# Patient Record
Sex: Female | Born: 1945
Health system: Southern US, Community
[De-identification: ages and names within clinical notes are randomized; demographics above are authoritative.]

## PROBLEM LIST (undated history)

## (undated) DIAGNOSIS — I10 Essential (primary) hypertension: Secondary | ICD-10-CM

## (undated) DIAGNOSIS — M1990 Unspecified osteoarthritis, unspecified site: Secondary | ICD-10-CM

## (undated) DIAGNOSIS — I2510 Atherosclerotic heart disease of native coronary artery without angina pectoris: Secondary | ICD-10-CM

## (undated) DIAGNOSIS — J849 Interstitial pulmonary disease, unspecified: Secondary | ICD-10-CM

## (undated) DIAGNOSIS — E7800 Pure hypercholesterolemia, unspecified: Secondary | ICD-10-CM

## (undated) HISTORY — PX: OTHER SURGICAL HISTORY: SHX169

## (undated) HISTORY — DX: Essential (primary) hypertension: I10

## (undated) HISTORY — PX: ANTERIOR CRUCIATE LIGAMENT REPAIR: SHX115

## (undated) HISTORY — PX: NASAL SINUS SURGERY: SHX719

## (undated) HISTORY — DX: Pure hypercholesterolemia, unspecified: E78.00

## (undated) HISTORY — PX: PELVIC LAPAROSCOPY: SHX162

## (undated) HISTORY — DX: Unspecified osteoarthritis, unspecified site: M19.90

## (undated) HISTORY — PX: COSMETIC SURGERY: SHX468

---

## 1986-08-16 HISTORY — PX: ABDOMINAL SURGERY: SHX537

## 1998-08-05 ENCOUNTER — Other Ambulatory Visit: Admission: RE | Admit: 1998-08-05 | Discharge: 1998-08-05 | Payer: Self-pay | Admitting: Obstetrics and Gynecology

## 1998-09-30 ENCOUNTER — Ambulatory Visit (HOSPITAL_COMMUNITY): Admission: RE | Admit: 1998-09-30 | Discharge: 1998-09-30 | Payer: Self-pay | Admitting: Gastroenterology

## 1999-07-20 ENCOUNTER — Ambulatory Visit (HOSPITAL_COMMUNITY): Admission: RE | Admit: 1999-07-20 | Discharge: 1999-07-20 | Payer: Self-pay | Admitting: Gastroenterology

## 1999-09-22 ENCOUNTER — Other Ambulatory Visit: Admission: RE | Admit: 1999-09-22 | Discharge: 1999-09-22 | Payer: Self-pay | Admitting: Obstetrics and Gynecology

## 2000-07-18 ENCOUNTER — Other Ambulatory Visit: Admission: RE | Admit: 2000-07-18 | Discharge: 2000-07-18 | Payer: Self-pay | Admitting: Obstetrics and Gynecology

## 2001-11-15 ENCOUNTER — Other Ambulatory Visit: Admission: RE | Admit: 2001-11-15 | Discharge: 2001-11-15 | Payer: Self-pay | Admitting: Obstetrics and Gynecology

## 2002-11-26 ENCOUNTER — Other Ambulatory Visit: Admission: RE | Admit: 2002-11-26 | Discharge: 2002-11-26 | Payer: Self-pay | Admitting: Obstetrics and Gynecology

## 2003-04-15 ENCOUNTER — Encounter: Payer: Self-pay | Admitting: Otolaryngology

## 2003-04-15 ENCOUNTER — Encounter: Admission: RE | Admit: 2003-04-15 | Discharge: 2003-04-15 | Payer: Self-pay | Admitting: Otolaryngology

## 2003-12-10 ENCOUNTER — Other Ambulatory Visit: Admission: RE | Admit: 2003-12-10 | Discharge: 2003-12-10 | Payer: Self-pay | Admitting: Obstetrics and Gynecology

## 2004-09-07 ENCOUNTER — Ambulatory Visit (HOSPITAL_COMMUNITY): Admission: RE | Admit: 2004-09-07 | Discharge: 2004-09-07 | Payer: Self-pay | Admitting: Cardiology

## 2004-09-21 ENCOUNTER — Ambulatory Visit: Payer: Self-pay | Admitting: Internal Medicine

## 2004-12-29 ENCOUNTER — Other Ambulatory Visit: Admission: RE | Admit: 2004-12-29 | Discharge: 2004-12-29 | Payer: Self-pay | Admitting: Obstetrics and Gynecology

## 2006-01-04 ENCOUNTER — Other Ambulatory Visit: Admission: RE | Admit: 2006-01-04 | Discharge: 2006-01-04 | Payer: Self-pay | Admitting: Obstetrics and Gynecology

## 2007-02-10 ENCOUNTER — Other Ambulatory Visit: Admission: RE | Admit: 2007-02-10 | Discharge: 2007-02-10 | Payer: Self-pay | Admitting: Obstetrics and Gynecology

## 2007-08-17 HISTORY — PX: OOPHORECTOMY: SHX86

## 2007-09-05 ENCOUNTER — Encounter: Admission: RE | Admit: 2007-09-05 | Discharge: 2007-09-05 | Payer: Self-pay | Admitting: Internal Medicine

## 2008-02-26 ENCOUNTER — Encounter: Admission: RE | Admit: 2008-02-26 | Discharge: 2008-02-26 | Payer: Self-pay | Admitting: Internal Medicine

## 2008-03-06 ENCOUNTER — Other Ambulatory Visit: Admission: RE | Admit: 2008-03-06 | Discharge: 2008-03-06 | Payer: Self-pay | Admitting: Obstetrics and Gynecology

## 2008-04-19 ENCOUNTER — Ambulatory Visit: Payer: Self-pay | Admitting: Obstetrics and Gynecology

## 2008-04-25 ENCOUNTER — Ambulatory Visit (HOSPITAL_BASED_OUTPATIENT_CLINIC_OR_DEPARTMENT_OTHER): Admission: RE | Admit: 2008-04-25 | Discharge: 2008-04-25 | Payer: Self-pay | Admitting: Obstetrics and Gynecology

## 2008-04-25 ENCOUNTER — Encounter: Payer: Self-pay | Admitting: Obstetrics and Gynecology

## 2008-05-03 ENCOUNTER — Ambulatory Visit: Payer: Self-pay | Admitting: Obstetrics and Gynecology

## 2008-05-08 ENCOUNTER — Ambulatory Visit: Payer: Self-pay | Admitting: Cardiovascular Disease

## 2008-05-22 ENCOUNTER — Ambulatory Visit: Payer: Self-pay | Admitting: Obstetrics and Gynecology

## 2009-04-01 ENCOUNTER — Other Ambulatory Visit: Admission: RE | Admit: 2009-04-01 | Discharge: 2009-04-01 | Payer: Self-pay | Admitting: Obstetrics and Gynecology

## 2009-04-01 ENCOUNTER — Ambulatory Visit: Payer: Self-pay | Admitting: Obstetrics and Gynecology

## 2009-04-01 ENCOUNTER — Encounter: Payer: Self-pay | Admitting: Obstetrics and Gynecology

## 2009-05-03 DIAGNOSIS — E782 Mixed hyperlipidemia: Secondary | ICD-10-CM | POA: Insufficient documentation

## 2009-05-03 DIAGNOSIS — E785 Hyperlipidemia, unspecified: Secondary | ICD-10-CM

## 2009-05-03 DIAGNOSIS — I10 Essential (primary) hypertension: Secondary | ICD-10-CM | POA: Insufficient documentation

## 2009-05-03 DIAGNOSIS — I2510 Atherosclerotic heart disease of native coronary artery without angina pectoris: Secondary | ICD-10-CM | POA: Insufficient documentation

## 2009-05-19 ENCOUNTER — Ambulatory Visit: Payer: Self-pay | Admitting: Cardiovascular Disease

## 2010-05-01 ENCOUNTER — Ambulatory Visit: Payer: Self-pay | Admitting: Obstetrics and Gynecology

## 2010-05-01 ENCOUNTER — Other Ambulatory Visit: Admission: RE | Admit: 2010-05-01 | Discharge: 2010-05-01 | Payer: Self-pay | Admitting: Obstetrics and Gynecology

## 2010-06-11 ENCOUNTER — Ambulatory Visit: Payer: Self-pay | Admitting: Cardiovascular Disease

## 2010-09-05 ENCOUNTER — Encounter: Payer: Self-pay | Admitting: Cardiology

## 2010-09-15 NOTE — Assessment & Plan Note (Signed)
Summary: f1y   Visit Type:  1 year follow up Primary Bianca [name]:  Dr Renne Crigler  CC:  No complaints.  History of Present Illness: 65 year old woman with a history of nonobstructive CAD, diagnosed by a coronary CT angiogram, presenting today for followup. Her underlying cardiovascular risk factors include essential hypertension and dyslipidemia.  The patient continues with a busy lifestyle. She does not exercise regularly, but is active with yardwork and daily activities. She denies exertional symptoms. No chest pain, dyspnea, edema, or lightheadedness.     Current Medications (verified): 1)  Multivitamins  Tabs (Multiple Vitamin) .... Take 1 Tablet By Mouth Once A Day 2)  Metoprolol Succinate 50 Mg Xr24h-Tab (Metoprolol Succinate) .... Take  1/2 Tablet By Mouth Daily 3)  Lipitor 40 Mg Tabs (Atorvastatin Calcium) .... Take One Tablet By Mouth Daily. 4)  Acidophilus Probiotic Blend  Tabs (Probiotic Product) .... Take 1 Tablet By Mouth Once A Day 5)  Calcium Carbonate-Vitamin D 600-400 Mg-Unit  Tabs (Calcium Carbonate-Vitamin D) .... Take2 Tablet By Mouth Two Times A Day 6)  Vitamin B-6 100 Mg Tabs (Pyridoxine Hcl) .... Take 1 Tablet By Mouth Two Times A Day 7)  Vitamin B-12 500 Mcg  Tabs (Cyanocobalamin) .... Take 1 Tablet By Mouth Once A Day 8)  Fish Oil 1000 Mg Caps (Omega-3 Fatty Acids) .... Take 1 Capsule By Mouth Once A Day 9)  Atacand 32 Mg Tabs (Candesartan Cilexetil) .... Take 1 Tablet By Mouth Once A Day 10)  Magnesium 500 Mg Tabs (Magnesium) .... Take 1 Tablet By Mouth Once A Day  Allergies (verified): No Known Drug Allergies  Past History:  Past medical history reviewed for relevance to current acute and chronic problems.  Past Medical History: Reviewed history from 05/19/2009 and no changes required. Nonobstructive CAD, diagnosed by a coronary CTA Essential hypertension Dyslipidemia  Vital Signs:  Patient profile:   65 year old female Height:      65 inches Weight:       157.50 pounds BMI:     26.30 Pulse rate:   61 / minute Pulse rhythm:   regular Resp:     18 per minute BP sitting:   124 / 84  (left arm) Cuff size:   large  Vitals Entered By: Vikki Ports (June 11, 2010 3:23 PM)  Physical Exam  General:  Pt is alert and oriented, in no acute distress. HEENT: normal Neck: normal carotid upstrokes without bruits, JVP normal Lungs: CTA CV: RRR without murmur or gallop Abd: soft, NT, positive BS, no bruit, no organomegaly Ext: no clubbing, cyanosis, or edema. peripheral pulses 2+ and equal Skin: warm and dry without rash    EKG  Procedure date:  04/08/2010  Findings:      Sinus brady 50 bpm, otherwise within normal limits.  Impression & Recommendations:  Problem # 1:  CAD (ICD-414.00) No anginal symptoms. Continue risk reduction measures as below.  Her updated medication list for this problem includes:    Metoprolol Succinate 50 Mg Xr24h-tab (Metoprolol succinate) .Marland Kitchen... Take  1/2 tablet by mouth daily  Problem # 2:  ESSENTIAL HYPERTENSION (ICD-401.9) Well-controlled.  The following medications were removed from the medication list:    Atacand Hct 32-12.5 Mg Tabs (Candesartan cilexetil-hctz) .Marland Kitchen... Take 1 tablet by mouth once a day Her updated medication list for this problem includes:    Metoprolol Succinate 50 Mg Xr24h-tab (Metoprolol succinate) .Marland Kitchen... Take  1/2 tablet by mouth daily    Atacand 32 Mg Tabs (Candesartan cilexetil) .Marland Kitchen... Take  1 tablet by mouth once a day  BP today: 124/84 Prior BP: 128/78 (05/19/2009)  Problem # 3:  DYSLIPIDEMIA (ICD-272.4) Lipids 04/09/10: Chol 179, LDL 74, HDL 85, Trig 99 At goal on current Rx.  Her updated medication list for this problem includes:    Lipitor 40 Mg Tabs (Atorvastatin calcium) .Marland Kitchen... Take one tablet by mouth daily.  Patient Instructions: 1)  Your physician recommends that you schedule a follow-up appointment in: 2 years 2)  Your physician recommends that you continue on your  current medications as directed. Please refer to the Current Medication list given to you today.

## 2010-12-05 ENCOUNTER — Emergency Department (HOSPITAL_COMMUNITY): Payer: BC Managed Care – PPO

## 2010-12-05 ENCOUNTER — Inpatient Hospital Stay (HOSPITAL_COMMUNITY)
Admission: EM | Admit: 2010-12-05 | Discharge: 2010-12-06 | DRG: 463 | Disposition: A | Discharge status: Home / Self Care | Payer: BC Managed Care – PPO | Attending: Internal Medicine | Admitting: Internal Medicine

## 2010-12-05 DIAGNOSIS — Z87891 Personal history of nicotine dependence: Secondary | ICD-10-CM

## 2010-12-05 DIAGNOSIS — E785 Hyperlipidemia, unspecified: Secondary | ICD-10-CM | POA: Diagnosis present

## 2010-12-05 DIAGNOSIS — E876 Hypokalemia: Secondary | ICD-10-CM | POA: Diagnosis present

## 2010-12-05 DIAGNOSIS — Z79899 Other long term (current) drug therapy: Secondary | ICD-10-CM

## 2010-12-05 DIAGNOSIS — E7800 Pure hypercholesterolemia, unspecified: Secondary | ICD-10-CM | POA: Diagnosis present

## 2010-12-05 DIAGNOSIS — R4182 Altered mental status, unspecified: Principal | ICD-10-CM | POA: Diagnosis present

## 2010-12-05 DIAGNOSIS — Z823 Family history of stroke: Secondary | ICD-10-CM

## 2010-12-05 DIAGNOSIS — Z7982 Long term (current) use of aspirin: Secondary | ICD-10-CM

## 2010-12-05 DIAGNOSIS — I2510 Atherosclerotic heart disease of native coronary artery without angina pectoris: Secondary | ICD-10-CM | POA: Diagnosis present

## 2010-12-05 DIAGNOSIS — I10 Essential (primary) hypertension: Secondary | ICD-10-CM | POA: Diagnosis present

## 2010-12-05 DIAGNOSIS — Z8249 Family history of ischemic heart disease and other diseases of the circulatory system: Secondary | ICD-10-CM

## 2010-12-05 DIAGNOSIS — D649 Anemia, unspecified: Secondary | ICD-10-CM | POA: Diagnosis present

## 2010-12-05 LAB — URINALYSIS, ROUTINE W REFLEX MICROSCOPIC
Hgb urine dipstick: NEGATIVE
Ketones, ur: NEGATIVE mg/dL
Protein, ur: NEGATIVE mg/dL
Urobilinogen, UA: 0.2 mg/dL (ref 0.0–1.0)

## 2010-12-05 LAB — COMPREHENSIVE METABOLIC PANEL
ALT: 23 U/L (ref 0–35)
AST: 32 U/L (ref 0–37)
Albumin: 4.6 g/dL (ref 3.5–5.2)
Alkaline Phosphatase: 69 U/L (ref 39–117)
CO2: 24 mEq/L (ref 19–32)
Chloride: 102 mEq/L (ref 96–112)
Creatinine, Ser: 0.83 mg/dL (ref 0.4–1.2)
GFR calc Af Amer: >60 mL/min (ref >60)
GFR calc non Af Amer: >60 mL/min (ref >60)
Potassium: 3.4 mEq/L — ABNORMAL LOW (ref 3.5–5.1)
Sodium: 135 mEq/L (ref 135–145)
Total Bilirubin: 0.7 mg/dL (ref 0.3–1.2)

## 2010-12-05 LAB — DIFFERENTIAL
Basophils Absolute: 0 10*3/uL (ref 0.0–0.1)
Basophils Relative: 0 % (ref 0–1)
Eosinophils Relative: 2 % (ref 0–5)
Lymphocytes Relative: 18 % (ref 12–46)
Monocytes Absolute: 0.5 10*3/uL (ref 0.1–1.0)

## 2010-12-05 LAB — CBC
HCT: 36.8 % (ref 36.0–46.0)
MCH: 30 pg (ref 26.0–34.0)
MCHC: 34 g/dL (ref 30.0–36.0)
RDW: 12.8 % (ref 11.5–15.5)

## 2010-12-05 LAB — POCT CARDIAC MARKERS

## 2010-12-06 ENCOUNTER — Inpatient Hospital Stay (HOSPITAL_COMMUNITY): Payer: BC Managed Care – PPO

## 2010-12-06 DIAGNOSIS — G459 Transient cerebral ischemic attack, unspecified: Secondary | ICD-10-CM

## 2010-12-06 DIAGNOSIS — I369 Nonrheumatic tricuspid valve disorder, unspecified: Secondary | ICD-10-CM

## 2010-12-06 LAB — LIPID PANEL
Cholesterol: 147 mg/dL (ref 0–200)
HDL: 63 mg/dL (ref >39)
LDL Cholesterol: 67 mg/dL (ref 0–99)
Total CHOL/HDL Ratio: 2.3 RATIO
VLDL: 17 mg/dL (ref 0–40)

## 2010-12-06 LAB — DIFFERENTIAL
Eosinophils Relative: 5 % (ref 0–5)
Lymphocytes Relative: 35 % (ref 12–46)
Lymphs Abs: 1.4 10*3/uL (ref 0.7–4.0)
Monocytes Absolute: 0.3 10*3/uL (ref 0.1–1.0)
Monocytes Relative: 8 % (ref 3–12)
Neutro Abs: 2 10*3/uL (ref 1.7–7.7)

## 2010-12-06 LAB — CBC
HCT: 33.1 % — ABNORMAL LOW (ref 36.0–46.0)
Hemoglobin: 11.1 g/dL — ABNORMAL LOW (ref 12.0–15.0)
MCHC: 33.5 g/dL (ref 30.0–36.0)
MCV: 88 fL (ref 78.0–100.0)
RDW: 12.9 % (ref 11.5–15.5)

## 2010-12-06 LAB — CARDIAC PANEL(CRET KIN+CKTOT+MB+TROPI)
CK, MB: 3 ng/mL (ref 0.3–4.0)
CK, MB: 2.4 ng/mL (ref 0.3–4.0)
Relative Index: 1.8 (ref 0.0–2.5)
Total CK: 166 U/L (ref 7–177)
Total CK: 150 U/L (ref 7–177)
Troponin I: 0.01 ng/mL (ref 0.00–0.06)

## 2010-12-06 LAB — HEMOGLOBIN A1C
Hgb A1c MFr Bld: 5.6 % (ref <5.7)
Mean Plasma Glucose: 114 mg/dL (ref <117)

## 2010-12-06 LAB — COMPREHENSIVE METABOLIC PANEL
Albumin: 3.7 g/dL (ref 3.5–5.2)
Alkaline Phosphatase: 55 U/L (ref 39–117)
BUN: 14 mg/dL (ref 6–23)
Calcium: 8.9 mg/dL (ref 8.4–10.5)
Glucose, Bld: 101 mg/dL — ABNORMAL HIGH (ref 70–99)
Potassium: 4.1 mEq/L (ref 3.5–5.1)
Sodium: 139 mEq/L (ref 135–145)
Total Protein: 6.6 g/dL (ref 6.0–8.3)

## 2010-12-06 LAB — APTT: aPTT: 27 seconds (ref 24–37)

## 2010-12-06 LAB — PROTIME-INR: INR: 1.01 (ref 0.00–1.49)

## 2010-12-12 NOTE — H&P (Signed)
[name], [name]               ACCOUNT NO.:  [id number]  MEDICAL RECORD NO.:  [id number]           PATIENT TYPE:  LOCATION:                                 FACILITY:  PHYSICIAN:  Bianca [name], MDDATE OF BIRTH:  1946/06/18  DATE OF ADMISSION:  12/06/2010 DATE OF DISCHARGE:                             HISTORY & PHYSICAL   PRIMARY CARE PROVIDER:  Soyla [name]. Bianca Crigler, MD  CHIEF COMPLAINT:  Confusion.  The patient is a 65 year old female with history of hypertension, hyperlipidemia who presented after her husband found her to be confused at around 8:10.  He talked to her at around 8 and she appeared to be quite normal at least initially and then after talking to her father, he noticed that she was confused and she was not quite sure what happened to her today.  All day, she worked outside in the yard.  After he was talking to her at about 8:10 p.m., she could not recollect what was she doing all day.  When he brought her to the emergency department, she had some recollection improving but still was slightly confused.  At this point, she is almost back to normal but does have slight trouble recollecting the words that I asked her to remember.  She does have a good memory of past events and alert and oriented x3.  He denied noticing any trouble walking, slurred speech, or any localized weakness.  Otherwise, no other complaint.  She has not had any chest pain or shortness of breath.  No fevers, no chills.  Otherwise, review of systems is negative.  PAST MEDICAL HISTORY:  Significant for: 1. Hypercholesteremia. 2. Hypertension.  SOCIAL HISTORY:  The patient has not smoked for the past 27 years.  She drinks occasionally red wine but not heavily.  FAMILY HISTORY:  Significant for father with coronary artery disease and recurrent TIAs leading to vascular dementia.  ALLERGIES:  None.  MEDICATIONS:  Lipitor 40 mg daily, Toprol 50 mg daily.  She was also taking another blood  pressure medication, name of which she cannot remember.  Aside from these, she takes baby aspirin daily, acidophilus daily, vitamin B6 daily, vitamin B12 daily, potassium daily, multivitamins, fish oil, calcium carbonate/vitamin D twice daily.  PHYSICAL EXAMINATION:  VITAL SIGNS:  Temperature 98.5; blood pressure 175/86; pulse 125 initially, now down to 60;  respirations 18, satting 100% on room air. GENERAL:  The patient appears to be in no acute distress. HEENT:  Head:  Nontraumatic.  Moist mucous membranes. LUNGS:  Clear to auscultation bilaterally. HEART:  Regular rate and rhythm.  No murmurs appreciated. ABDOMEN:  Soft, nontender, and nondistended. EXTREMITIES:  Lower extremities without clubbing, cyanosis, or edema. NEUROLOGIC:  Grossly intact.  Strength 5/5 in all four extremities. Cranial nerves intact.  The patient is alert and oriented x3.  When asked to recollect three words, she struggles to remember one out of three and then continues to have trouble with remembering this word even after being reminded.  LABORATORY DATA:  White blood cell count 8.8, hemoglobin 12.5.  Sodium 135, potassium 3.4, creatinine 0.83.  LFTs are within normal  limits. Cardiac enzymes unremarkable.  UA unremarkable.  Chest x-ray showing questionable COPD but otherwise, no cardiopulmonary changes. CT scan of head is unremarkable. EKG showing heart rate of 55, no significant ischemic changes noted.  ASSESSMENT AND PLAN:  This is a 65 year old female with hypertension who presents with elevated blood pressure and generalized confusion after a day working in sun. 1. Generalized confusion, trouble with short-term memory.  We will     workup for transient ischemic attack/cerebrovascular accident     versus hypertensive encephalopathy.  We will admit to telemetry     under stroke protocol, obtain MRI/MRA in a.m.  We would recommend     Neurology consult if she continues to have symptoms.  We will      obtain also 2-D echo and carotid Dopplers.  We will try to control     her blood pressure but avoid overtreating. 2. Hypertension.  Continue her metoprolol and with holding parameters,     we will write for hydralazine p.r.n. to avoid severe blood pressure     increase. 3. Prophylaxis.  Good p.o. intake and SCDs.     Bianca Cowboy, MD     AVD/MEDQ  D:  12/06/2010  T:  12/06/2010  Job:  [id number]  cc:   Bianca [name]. Bianca [name], M.D.  Electronically Signed by Bianca Doyne MD on 12/12/2010 09:42:21 PM

## 2010-12-25 NOTE — Discharge Summary (Signed)
NAMEMACAILA, TAHIR NO.:  [id number]  MEDICAL RECORD NO.:  [id number]           PATIENT TYPE:  I  LOCATION:  3704                         FACILITY:  [facility]  PHYSICIAN:  Marcellus Scott, MD     DATE OF BIRTH:  1946-01-01  DATE OF ADMISSION:  12/05/2010 DATE OF DISCHARGE:  12/06/2010                              DISCHARGE SUMMARY   PRIMARY CARE PHYSICIAN:  Soyla Murphy. Renne Crigler, MD  CARDIOLOGIST:  Veverly Fells. Excell Seltzer, MD  DISCHARGE DIAGNOSES: 1. Transient altered mental status, word-finding difficulty.     Resolved.  Etiology unclear. 2. Hypokalemia, repleted. 3. Hypertension, controlled. 4. Hyperlipidemia. 5. Anemia, for outpatient evaluation.  DISCHARGE MEDICATIONS:  Unchanged from those prior to admission.  1. Aspirin 81 mg p.o. daily. 2. Acidophilus over-the-counter 1 tablet p.o. daily. 3. Calcium carbonate over-the-counter 1 tablet p.o. daily. 4. Calcium carbonate/vitamin D over-the-counter 1 tablet p.o. b.i.d. 5. Fish oil over-the-counter 1 tablet p.o. daily. 6. Lipitor 40 mg p.o. bedtime. 7. Metoprolol XL 50 mg p.o. daily. 8. Micardis 80 mg p.o. daily. 9. Multivitamins over-the-counter 1 tablet p.o. daily. 10.Potassium over the counter 1 tablet p.o. daily. 11.Vitamin B12 over-the-counter 1 tablet p.o. daily. 12.Vitamin B6 over-the-counter 1 tablet p.o. daily.  IMAGING: 1. MRI of the head without contrast, impression:     a.     No acute intracranial abnormality.     b.     Mild-to-moderate for age nonspecific white matter signal      changes. 2. MRA of the head without contrast, impression, negative intracranial     MRA. 3. Chest x-ray, December 05, 2010, impression.     a.     Findings of COPD.  Lungs otherwise clear.     b.     Mild degenerative changes at the upper lumbar spine. 4. CT of the head without contrast, impression no acute intracranial     abnormality. 5. A 2-D echocardiogram.  CONCLUSIONS:  Left ventricle cavity size was normal.   Wall thickness was normal.  Systolic function was normal.  The estimated ejection fraction was in the range of 60-65%.  Wall motion was normal.  There were no regional wall motion abnormalities.  Atrial septum was increased thickness of the septum, consistent with lipomatous hypertrophy.  Pulmonary artery systolic pressure was mildly to moderately increased.  6. Carotid Dopplers.  Preliminary report.  No internal carotid artery stenosis.  Vertebral antegrade.  PERTINENT LABORATORY DATA:  Cardiac enzymes were cycled and negative. Lipid panel within normal limits.  Comprehensive metabolic panel unremarkable except for glucose 101, BUN was 14, creatinine 0.75, and potassium was 4.1.  Coagulation indices within normal limits.  CBC shows hemoglobin 11.1, hematocrit 33, white blood cell 3.9, MCV 88, platelets 171.  Urinalysis did not show features of urinary tract infection.  CONSULTATIONS:  None.  DIET:  Heart healthy diet.  ACTIVITY:  Ad lib.  COMPLAINTS AT THIS TIME:  None.  PHYSICAL EXAMINATION:  GENERAL:  The patient is in no obvious distress. VITAL SIGNS:  Temperature 98.7 degrees Fahrenheit, pulse 59 per minute regular, respirations 20 per minute, blood pressure 96/62 mmHg, and saturating  at 97% on room air. RESPIRATORY SYSTEM:  Clear. CARDIOVASCULAR SYSTEM:  First and second heart sounds heard regular.  No JVD. ABDOMEN:  Nondistended, nontender, soft and bowel sounds present. CENTRAL NERVOUS SYSTEM:  Patient is awake, alert, and oriented x3 with no focal neurological deficits.  HOSPITAL COURSE:  Ms. Hobday is a 65 year old female patient with history of hypertension, hyperlipidemia, nonobstructive coronary artery disease who has been suffering from allergies of her upper respiratory tracts for the last 3 months and has completed a couple of courses of antibiotics and is on anti allergy medications.  She spent few hours in her yard gardening and flowering plants.  She  remembers coming into her house at approximately 8 p.m. last night and then has no recollection of events from 8 p.m. to 9:15 p.m.  Her husband apparently found her to be confused.  She was brought to the emergency room where she had word- finding difficulties.  She was admitted as a TIA, rule out CVA diagnosis.  However, within a short period of time her speech abnormalities resolved.  Extensive evaluation has been done as above which has not revealed any abnormality.  The patient is awake, alert, oriented with no focal deficits.  The etiology of her transient confusion, word-finding difficulties is unclear.  Unclear if this was secondary to dehydration from working in her garden and not hydrating herself and versus the TIA.  In any event, the patient's blood pressure control, lipid control is excellent.  The patient at this time will be discharged home.  DISPOSITION:  The patient is discharged home in stable condition.  FOLLOWUP RECOMMENDATIONS:  With Dr. Merri Brunette.  The patient is to call for an appointment.  Consider outpatient evaluation of her anemia as deemed necessary.  The patient does not have any bleeding.  This dictator discussed her care with her spouse who was at the bedside at the patient's request.  Time taken in coordinating this discharge is 30 minutes.     Marcellus Scott, MD     AH/MEDQ  D:  12/06/2010  T:  12/06/2010  Job:  [id number]  cc:   Soyla Murphy. Renne Crigler, M.D. Veverly Fells. Excell Seltzer, MD  Electronically Signed by Marcellus Scott MD on 12/25/2010 12:55:49 AM

## 2010-12-29 NOTE — Assessment & Plan Note (Signed)
Bianca [name]                            CARDIOLOGY OFFICE NOTE   Bianca, [name]                      MRN:          [id number]  DATE:05/08/2008                            DOB:          May 05, 1946    REASON FOR VISIT:  Evaluate the patient with nonobstructive CAD.   HISTORY OF PRESENT ILLNESS:  Bianca [name] is a healthy 65 year old woman  who presents to establish care.  She has undergone a CT coronary  angiogram back in 2008 which demonstrated mild nonobstructive plaque in  the left circumflex and LAD.  She presents for further evaluation and  management of her new cardiac risk factors.   Bianca [name] feels well.  She is not currently engaged in regular  exercise, but has been an avid exerciser over the years.  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  She  specifically denies chest pain, exertional dyspnea, orthopnea, PND, or  edema.  She has had no lightheadedness, palpitations, or syncope.  She  has no history of cardiac disease or medical hospitalizations.  She  underwent a CT angiogram on October 20, 2006, because she has a family  history of coronary artery disease.  This demonstrated normal left  ventricular wall motion with an LVEF of 62%.   FINDINGS:  As follows:  The left mainstem was free of disease.  The  right coronary artery was free of disease.  The LAD had a minor  eccentric small plaque with no luminal narrowing and just beyond the  first diagonal branch in the distal LAD had a second area of nonstenotic  calcific plaque near the apex.  The left circumflex had a small calcific  plaque, also nonobstructive.  The patient also underwent a stress test  in January 2006.  I do not have the exercise data, but the  interpretation reports no evidence of myocardial ischemia or infarction  normal wall motion, gated EF was 72%.   CURRENT MEDICATIONS:  Multivitamin daily, B6, B12 daily, CoQ10 daily,  omega-3 fish oil daily, aspirin 81 mg daily, calcium daily, potassium  daily, acidophilus daily, Citracal with vitamin D daily, metoprolol 25  mg daily, Atacand 32/12.5 mg daily, Lipitor 40 mg daily.   ALLERGIES:  NKDA.   PAST MEDICAL HISTORY:  1. Essential hypertension.  2. Dyslipidemia  3. Nonobstructive CAD as outlined above.  4. ACL reconstruction remotely.  5. Oophorectomy, April 25, 2008.   SOCIAL HISTORY:  The patient is married.  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  She does not have children.  She has not smoked  cigarettes in 22 years.  She she drinks 1-2 glasses of red wine on  occasion.  No illicit drugs.   FAMILY HISTORY:  The patient's father had multivessel coronary artery  disease and underwent coronary bypass in his 63s.  Her mother died at  age 35 of pancreatic cancer.  Father died at age 82 of heart problems.  She  has a brother who died at age 69 in an auto accident, and another  brother who is healthy.  Her paternal grandfather died of a stroke at  age 68.   REVIEW OF SYSTEMS:  A complete 12 point review of systems was performed,  pertinent positives included gastroesophageal reflux, osteoarthritis  with knee pain, seasonal allergies, and all other systems were negative.   PHYSICAL EXAMINATION:  GENERAL:  On exam, the patient is alert and  oriented.  She is in no acute distress.  She is an age-appropriate white  female.  VITAL SIGNS:  Weight is 159 pounds, blood pressure 120/80, heart rate  80, respiratory rate is 12.  HEENT:  Normal.  NECK:  Normal carotid upstrokes.  No bruits.  JVP normal.  No  thyromegaly or thyroid nodules.  LUNGS:  Clear bilaterally.  HEART:  Regular rate and rhythm.  No murmurs or gallops.  The apex is  discrete and nondisplaced.  There is no right ventricular heave or lift.  ABDOMEN:  Soft, nontender, no organomegaly, no abdominal bruits, no   masses.  BACK:  No CVA tenderness.  EXTREMITIES:  No clubbing, cyanosis, or edema.  Peripheral pulses are 2+  and equal throughout.  SKIN:  Warm and dry.  There is no rash.  NEUROLOGIC:  Cranial nerves II through XII are intact.  Strength is  intact and equal bilaterally.   An EKG has sinus rhythm within normal limits.   ASSESSMENT:  1. Nonobstructive coronary artery disease.  The patient has mild      plaque from a CT angiogram study.  She is completely asymptomatic.      Her main issue will revolve around preventative measures and      aggressive treatment of her hypertension and dyslipidemia.  She is      on an excellent medical program under the care of Dr. Renne Crigler.  I      recommend continuing her 81 mg aspirin along with treatment of her      hypertension and dyslipidemia.  I encouraged her to continue with      an active lifestyle.  We will plan on yearly followup to focus on      prevention.  2. Essential hypertension.  Blood pressure in ideal range on      combination of metoprolol, Atacand, and hydrochlorothiazide.  3. Dyslipidemia.  I do not have a copy of her lipids, but she is on      treatment with atorvastatin and fish oil.  We will request lab work      from Dr. Carolee Rota office.   For followup, I would like to see Bianca [name] back on a yearly basis.    Veverly Fells. Excell Seltzer, MD  Electronically Signed   MDC/MedQ  DD: 05/13/2008  DT: 05/14/2008  Job #: [id number]   cc:   Soyla Murphy. Renne Crigler, M.D.

## 2010-12-29 NOTE — Op Note (Signed)
Bianca [name], Bianca [name]               ACCOUNT NO.:  [id number]   MEDICAL RECORD NO.:  [id number]          PATIENT TYPE:  AMB   LOCATION:  [facility]                         FACILITY:  [facility]   PHYSICIAN:  Daniel L. Gottsegen, M.D.DATE OF BIRTH:  06-Aug-1946   DATE OF PROCEDURE:  04/25/2008  DATE OF DISCHARGE:                               OPERATIVE REPORT   PREOPERATIVE DIAGNOSIS:  Persistent complex left ovarian cyst.   POSTOPERATIVE DIAGNOSIS:  Persistent complex left ovarian cyst.   NAME OF OPERATION:  Diagnostic laparoscopy with bilateral salpingo-  oophorectomy.   SURGEON:  Dr. Eda Paschal.   FIRST ASSISTANT:  Colin Broach, MD.   INDICATIONS:  The patient is a 65 year old nulligravida who had  presented to the office with pelvic pain and on ultrasound she had a  complex left ovarian cyst.  It was thin-walled, however there was some  blood flow to it and inside the mostly echo-free cyst there was an  echogenic focus that could be a transmural nodule.  It was watched for 3-  4 months and it did not go away, it did not change.  The patient was  told that although it appeared not to be malignant that she could not be  unequivocally guaranteed because it was complex that it was not, and she  has decided to have it removed to be sure that it indeed is benign.  Because she is 65, we will remove both her ovaries and tubes.   FINDINGS:  At the time of surgery patient had a very small uterus.  Her  right ovary was somewhat adherent to the broad ligament but it was  normal as was the right fallopian tube.  Her left ovary was enlarged by  a cyst that was probably in a 2-3 cm size.  The echogenic focus within  the cyst wall could not be seen.  Other than that, the left ovary and  tube were normal.  Pelvic peritoneum was free of any disease.   PROCEDURE:  After adequate general endotracheal anesthesia, the patient  was placed in the dorsal supine position, prepped and draped in usual  sterile  manner.  A Foley catheter inserted in the patient's bladder.  Using an Optiview and a 10-mm laparoscope, the peritoneal cavity was  entered under direct visualization without trauma.  Right and left lower  quadrant pelvic incisions were made for 5 mm trocars.  Peritoneal  washings were obtained after the pelvis had been visualized.  The right  adnexa was elevated, the ureter was identified.  The right infundibular  pelvic ligament was bipolared and cut.  All attachments of the ovary and  tube to the uterus and to the broad ligament were bipolared and cut.  A  portion of the ovary had ripped and this was excised separately but was  to be sent at the same time, there was no bleeding noted.  Attention was  turned to the left side where the cyst was.  The ovary and tube were  elevated without rupturing the cyst.  The ureter was once again  identified.  The IP ligament was again  bipolared and cut as were all  attachments of the ovary and tube to the uterus.  Finally, both  specimens were free.  There was no bleeding noted.  An Endopouch was  inserted through the subumbilical incision, a 5 mm scope was placed in  the lower incision and the specimens were removed and sent as left ovary  and tube with cyst, right ovary and tube.  The pelvis was then  reinspected.  Once again there was no bleeding noted.  The trocars were  removed.  The pneumoperitoneum was evacuated.  The subumbilical fascial  incision was closed with #0 Vicryl and the two  lower incisions were closed with #4-0 Monocryl.  Blood loss for the  entire procedure was minimal.  The patient left the operating room in  satisfactory condition.  Her Foley catheter, which drained clear urine  during the procedure, was removed.      Daniel L. Eda Paschal, M.D.  Electronically Signed     DLG/MEDQ  D:  04/25/2008  T:  04/25/2008  Job:  [id number]

## 2011-01-04 ENCOUNTER — Other Ambulatory Visit: Payer: Self-pay | Admitting: Gastroenterology

## 2011-04-21 ENCOUNTER — Encounter: Payer: Self-pay | Admitting: Cardiovascular Disease

## 2011-05-19 ENCOUNTER — Encounter: Payer: Self-pay | Admitting: Cardiovascular Disease

## 2011-05-26 ENCOUNTER — Encounter: Payer: Self-pay | Admitting: Gynecology

## 2011-05-26 DIAGNOSIS — M1990 Unspecified osteoarthritis, unspecified site: Secondary | ICD-10-CM | POA: Insufficient documentation

## 2011-05-26 DIAGNOSIS — D279 Benign neoplasm of unspecified ovary: Secondary | ICD-10-CM | POA: Insufficient documentation

## 2011-06-01 ENCOUNTER — Encounter: Payer: Self-pay | Admitting: Obstetrics and Gynecology

## 2011-06-01 ENCOUNTER — Ambulatory Visit (INDEPENDENT_AMBULATORY_CARE_PROVIDER_SITE_OTHER): Payer: Medicare Other | Admitting: Obstetrics and Gynecology

## 2011-06-01 ENCOUNTER — Other Ambulatory Visit (HOSPITAL_COMMUNITY)
Admission: RE | Admit: 2011-06-01 | Discharge: 2011-06-01 | Disposition: A | Discharge status: Home / Self Care | Payer: Medicare Other | Source: Ambulatory Visit | Attending: Obstetrics and Gynecology | Admitting: Obstetrics and Gynecology

## 2011-06-01 VITALS — BP 120/76 | Ht 65.0 in | Wt 159.0 lb

## 2011-06-01 DIAGNOSIS — N952 Postmenopausal atrophic vaginitis: Secondary | ICD-10-CM

## 2011-06-01 DIAGNOSIS — Z01419 Encounter for gynecological examination (general) (routine) without abnormal findings: Secondary | ICD-10-CM | POA: Insufficient documentation

## 2011-06-01 DIAGNOSIS — R350 Frequency of micturition: Secondary | ICD-10-CM

## 2011-06-01 DIAGNOSIS — Z780 Asymptomatic menopausal state: Secondary | ICD-10-CM

## 2011-06-01 DIAGNOSIS — R3915 Urgency of urination: Secondary | ICD-10-CM

## 2011-06-01 DIAGNOSIS — Z124 Encounter for screening for malignant neoplasm of cervix: Secondary | ICD-10-CM

## 2011-06-01 DIAGNOSIS — N951 Menopausal and female climacteric states: Secondary | ICD-10-CM

## 2011-06-01 NOTE — Progress Notes (Signed)
Subjective:     Patient ID: Bianca [name], female   DOB: [birth year], 65 y.o.   MRN: [id number]  HPIpatient came back to see me today for further follow up. She continues to have vaginal dryness with dyspareunia. She's tried a variety of estrogen creams without real good results. At the moment she is okay just observing the above. Her menopausal symptoms are getting less and less frequent. She is up-to-date on mammograms and bone densities. She's having no vaginal bleeding or pelvic pain. She has urinary urgency, some frequency, and nocturia x1. She has no dysuria or hematuria. She had hemorrhoids. Her GI doctor did an office procedure to remove them. She was hospitalized for dehydration in May. All studies came back normal and she responded to IV fluids.   Review of Systems  Constitutional: Negative.   HENT: Negative.   Eyes: Negative.   Respiratory: Negative.   Cardiovascular:       Hyperlipidemia on medication. Hypertension on medication.  Gastrointestinal: Negative.   Genitourinary:       See history of present illness.  Musculoskeletal: Negative.   Skin: Negative.   Neurological: Negative.   Hematological: Negative.   Psychiatric/Behavioral: Negative.        Objective:   Physical ExamPhysical examination: Kennon Portela present. HEENT within normal limits. Neck: Thyroid not large. No masses. Supraclavicular nodes: not enlarged. Breasts: Examined in both sitting midline position. No skin changes and no masses. Abdomen: Soft no guarding rebound or masses or hernia. Pelvic: External: Within normal limits. BUS: Within normal limits. Vaginal:within normal limits. Poor estrogen effect. No evidence of cystocele rectocele or enterocele. Cervix: clean. Uterus: Normal size and shape. Adnexa: No masses. Rectovaginal exam: Confirmatory and negative. Extremities: Within normal limits.       Assessment:     #1. Atrophic vaginitis #2. Menopausal symptoms #3. Urinary urgency #4. Nocturia      Plan:     We discussed all the above. We discussed medication for all the above. The moment the patient declined.

## 2011-06-09 ENCOUNTER — Encounter: Payer: Self-pay | Admitting: Cardiovascular Disease

## 2011-07-02 ENCOUNTER — Encounter: Payer: Self-pay | Admitting: Obstetrics and Gynecology

## 2012-05-19 ENCOUNTER — Encounter: Payer: Self-pay | Admitting: Cardiovascular Disease

## 2012-05-19 ENCOUNTER — Ambulatory Visit (INDEPENDENT_AMBULATORY_CARE_PROVIDER_SITE_OTHER): Payer: Medicare Other | Admitting: Cardiovascular Disease

## 2012-05-19 VITALS — BP 148/89 | HR 57 | Resp 17 | Ht 65.0 in | Wt 152.8 lb

## 2012-05-19 DIAGNOSIS — I2510 Atherosclerotic heart disease of native coronary artery without angina pectoris: Secondary | ICD-10-CM

## 2012-05-19 DIAGNOSIS — I10 Essential (primary) hypertension: Secondary | ICD-10-CM

## 2012-05-19 NOTE — Patient Instructions (Addendum)
Your physician has requested that you have an exercise tolerance test. For further information please visit [email or web address]. Please also follow instruction sheet, as given.  Your physician wants you to follow-up in: 1 YEAR with Dr Cooper.  You will receive a reminder letter in the mail two months in advance. If you don't receive a letter, please call our office to schedule the follow-up appointment.  Your physician recommends that you continue on your current medications as directed. Please refer to the Current Medication list given to you today.  

## 2012-05-19 NOTE — Progress Notes (Signed)
   HPI:  66 year old woman presenting for followup evaluation. The patient has a history of nonobstructive CAD based on coronary CT angiography. She is followed for hypertension and dyslipidemia. She was last seen in 2011.  The patient is undergoing a very stressful time because of her husband's illness. He's been diagnosed with head and neck cancer. She's tearful during her evaluation today. She denies chest pain or pressure, palpitations, orthopnea, or PND. She's had mild shortness of breath she relates to seasonal allergies.  Outpatient Encounter Prescriptions as of 05/19/2012  Medication Sig Dispense Refill  . atorvastatin (LIPITOR) 40 MG tablet Take 40 mg by mouth daily.        . Calcium Carbonate-Vitamin D (CALCIUM + D PO) Take by mouth.        . Cyanocobalamin (VITAMIN B 12 PO) Take by mouth.        . fish oil-omega-3 fatty acids 1000 MG capsule Take 1 g by mouth daily.        Marland Kitchen MAGNESIUM PO Take by mouth.        . METOPROLOL TARTRATE PO Take by mouth.        . Multiple Vitamin (MULTIVITAMIN) tablet Take 1 tablet by mouth daily.        . rosuvastatin (CRESTOR) 40 MG tablet Take 40 mg by mouth daily.        Marland Kitchen telmisartan-hydrochlorothiazide (MICARDIS HCT) 80-12.5 MG per tablet Take 1 tablet by mouth daily.          No Known Allergies  Past Medical History  Diagnosis Date  . Cystadenofibroma of ovary     Left ovary  . Arthritis   . Elevated cholesterol   . Hypertension     ROS: Negative except as per HPI  BP 148/89  Pulse 57  Resp 17  Ht 5\' 5"  (1.651 m)  Wt 69.31 kg (152 lb 12.8 oz)  BMI 25.43 kg/m2  SpO2 96%  PHYSICAL EXAM: Pt is alert and oriented, NAD HEENT: normal Neck: JVP - normal, carotids 2+= without bruits Lungs: CTA bilaterally CV: RRR without murmur or gallop Abd: soft, NT, Positive BS, no hepatomegaly Ext: no C/C/E, distal pulses intact and equal Skin: warm/dry no rash  EKG:  Sinus bradycardia 54 beats per minute, within normal limits  ASSESSMENT AND  PLAN: 1. Native vessel coronary artery disease, nonobstructive. The patient has hypertension, hyperlipidemia, and a strong family history of CAD. She has not currently having anginal symptoms. I have recommended a treadmill exercise test for CAD surveillance.  2. Hypertension. Blood pressure is elevated today, but she is very emotional. I've recommended that she continue her same program and monitor her blood pressure regularly.  3. Hyperlipidemia. Lipids are followed by her primary care physician. She will fax copies of her upcoming lab work.  Tonny Bollman 05/24/2012 10:16 PM

## 2012-06-05 ENCOUNTER — Ambulatory Visit (INDEPENDENT_AMBULATORY_CARE_PROVIDER_SITE_OTHER): Payer: Medicare Other | Admitting: Obstetrics and Gynecology

## 2012-06-05 ENCOUNTER — Encounter: Payer: Self-pay | Admitting: Obstetrics and Gynecology

## 2012-06-05 VITALS — BP 124/76 | Ht 65.0 in | Wt 151.0 lb

## 2012-06-05 DIAGNOSIS — R351 Nocturia: Secondary | ICD-10-CM

## 2012-06-05 DIAGNOSIS — Z780 Asymptomatic menopausal state: Secondary | ICD-10-CM

## 2012-06-05 DIAGNOSIS — N952 Postmenopausal atrophic vaginitis: Secondary | ICD-10-CM

## 2012-06-05 DIAGNOSIS — K649 Unspecified hemorrhoids: Secondary | ICD-10-CM

## 2012-06-05 NOTE — Patient Instructions (Signed)
Schedule mammogram next month.

## 2012-06-05 NOTE — Progress Notes (Signed)
Patient came to see me today for further followup. Unfortunately her husband has recently been diagnosed with stage IV tonsillar cancer and is now undergoing chemotherapy. Patient is doing well. She does get hot flashes in the morning. They are tolerable without HRT. She does have severe atrophic vaginitis. This caused significant dyspareunia. She has not responded to vaginal estrogen. They are both fine without intercourse. She has never had an abnormal Pap smear. Her last Pap smear was 2012. She is due for a mammogram next month. She does bone densities through her PCP. There fine. She is having no vaginal bleeding. She is having no pelvic pain. She has nocturia 1-2 nightly. She is not having urgency or incontinence. She was having trouble with her hemorrhoids that we had discussed. She is now had 3 hemorrhoids banded and is asymptomatic. In 2009 we did a laparoscopic bilateral salpingo-oophorectomy for benign cystadenofibroma.  ROS: 12 system review done. Pertinent positives above. Other positives include dyslipidemia, hypertension, and 3 coronary artery blockage which is responding to medication.She also has osteoarthritis.  Physical examination:Kim Julian Reil present. HEENT within normal limits. Neck: Thyroid not large. No masses. Supraclavicular nodes: not enlarged. Breasts: Examined in both sitting and lying  position. No skin changes and no masses. Abdomen: Soft no guarding rebound or masses or hernia. Pelvic: External: Within normal limits. BUS: Within normal limits. Vaginal:within normal limits. Poor estrogen effect. No evidence of cystocele rectocele or enterocele. Cervix: clean. Uterus: Normal size and shape. Adnexa: No masses. Rectovaginal exam: Confirmatory and negative. Extremities: Within normal limits.  Assessment: #1. Hot flashes #2. Atrophic vaginitis #3. Nocturia #4. Hemorrhoids  Plan: Observation of symptoms. Pap not done.The new Pap smear guidelines were discussed with the patient.  Mammogram in November.

## 2012-06-13 ENCOUNTER — Encounter: Payer: Self-pay | Admitting: Physician Assistant

## 2012-06-13 ENCOUNTER — Encounter: Payer: Self-pay | Admitting: Cardiovascular Disease

## 2012-06-30 ENCOUNTER — Telehealth: Payer: Self-pay | Admitting: Cardiovascular Disease

## 2012-06-30 NOTE — Telephone Encounter (Signed)
Spoke with pt about EKG results done 05/19/12

## 2012-06-30 NOTE — Telephone Encounter (Signed)
PT WOULD LIKE RESULTS OF EKG

## 2012-07-05 ENCOUNTER — Encounter: Payer: Self-pay | Admitting: Physician Assistant

## 2012-08-21 ENCOUNTER — Ambulatory Visit (INDEPENDENT_AMBULATORY_CARE_PROVIDER_SITE_OTHER): Payer: Medicare Other | Admitting: Physician Assistant

## 2012-08-21 DIAGNOSIS — I10 Essential (primary) hypertension: Secondary | ICD-10-CM

## 2012-08-21 DIAGNOSIS — I2510 Atherosclerotic heart disease of native coronary artery without angina pectoris: Secondary | ICD-10-CM

## 2012-08-21 NOTE — Progress Notes (Signed)
Exercise Treadmill Test  Pre-Exercise Testing Evaluation Rhythm: normal sinus  Rate: 54                 Test  Exercise Tolerance Test Ordering MD: Tonny Bollman, MD  Interpreting MD: Tereso Newcomer, PA-C  Unique Test No: 1  Treadmill:  1  Indication for ETT: R/O ischemia  Contraindication to ETT: No   Stress Modality: exercise - treadmill  Cardiac Imaging Performed: non   Protocol: standard Bruce - maximal  Max BP:  200/83  Max MPHR (bpm):  154 85% MPR (bpm):  131  MPHR obtained (bpm):  148 % MPHR obtained:  96  Reached 85% MPHR (min:sec):  3:53 Total Exercise Time (min-sec):  9:00  Workload in METS:  10.1 Borg Scale: 17  Reason ETT Terminated:  desired heart rate attained    ST Segment Analysis At Rest: normal ST segments - no evidence of significant ST depression With Exercise: no evidence of significant ST depression  Other Information Arrhythmia:  Yes Angina during ETT:  absent (0) Quality of ETT:  diagnostic  ETT Interpretation:  normal - no evidence of ischemia by ST analysis  Comments: Good exercise tolerance. No chest pain. Normal BP response to exercise. Ventricular Bigeminy occurred in stage I. No ST-T changes to suggest ischemia.   Recommendations: Follow up with Dr. Tonny Bollman as directed. Signed,  Tereso Newcomer, PA-C  2:33 PM 08/21/2012

## 2012-09-04 ENCOUNTER — Other Ambulatory Visit: Payer: Self-pay | Admitting: Gastroenterology

## 2012-09-04 DIAGNOSIS — R1013 Epigastric pain: Secondary | ICD-10-CM

## 2012-09-07 ENCOUNTER — Ambulatory Visit
Admission: RE | Admit: 2012-09-07 | Discharge: 2012-09-07 | Disposition: A | Discharge status: Home / Self Care | Payer: Medicare Other | Source: Ambulatory Visit | Attending: Gastroenterology | Admitting: Gastroenterology

## 2012-09-07 DIAGNOSIS — R1013 Epigastric pain: Secondary | ICD-10-CM

## 2012-09-21 ENCOUNTER — Encounter: Payer: Self-pay | Admitting: Cardiovascular Disease

## 2012-09-21 NOTE — Telephone Encounter (Signed)
This encounter was created in error - please disregard.

## 2012-09-21 NOTE — Telephone Encounter (Signed)
Pt calling because she got a bill for her stress test but hasn't be called with the results, pls call

## 2013-05-21 ENCOUNTER — Ambulatory Visit: Payer: Medicare Other | Admitting: Cardiovascular Disease

## 2013-06-06 ENCOUNTER — Encounter: Payer: Medicare Other | Admitting: Gynecology

## 2013-07-18 ENCOUNTER — Encounter: Payer: Self-pay | Admitting: Cardiovascular Disease

## 2013-07-18 ENCOUNTER — Ambulatory Visit (INDEPENDENT_AMBULATORY_CARE_PROVIDER_SITE_OTHER): Payer: Medicare Other | Admitting: Cardiovascular Disease

## 2013-07-18 VITALS — BP 144/88 | HR 60 | Ht 65.0 in | Wt 157.0 lb

## 2013-07-18 DIAGNOSIS — I2510 Atherosclerotic heart disease of native coronary artery without angina pectoris: Secondary | ICD-10-CM

## 2013-07-18 NOTE — Progress Notes (Signed)
    HPI:  67 year old woman presenting for followup evaluation. The patient has a history of nonobstructive CAD diagnosed on a gated cardiac CTA. She is also followed for hypertension and hyperlipidemia. She was last seen one year ago. The patient had an exercise treadmill study in January 2014 and demonstrated good exercise tolerance with no significant ST or T wave changes during exertion. Labs are followed by her primary care physician. The patient is maintained on Crestor for hyperlipidemia.   She's developed some upper abdominal cramps that she thinks may be related to high-dose Crestor. Her dose was reduced to 20 mg a few weeks ago and she seems to be feeling a little better. She is physically active and denies chest pain, chest pressure, dyspnea, palpitations, or edema. She takes aspirin about every other day and then limits her dose because of easy bruising.  Outpatient Encounter Prescriptions as of 07/18/2013  Medication Sig  . Cyanocobalamin (VITAMIN B 12 PO) Take by mouth.    . fish oil-omega-3 fatty acids 1000 MG capsule Take 1 g by mouth daily.    Marland Kitchen MAGNESIUM PO Take by mouth.    . METOPROLOL TARTRATE PO Take 40 mg by mouth daily.   . rosuvastatin (CRESTOR) 40 MG tablet Take 20 mg by mouth daily.   Marland Kitchen telmisartan-hydrochlorothiazide (MICARDIS HCT) 80-12.5 MG per tablet Take 1 tablet by mouth daily.    . [DISCONTINUED] Calcium Carbonate-Vitamin D (CALCIUM + D PO) Take by mouth.    . [DISCONTINUED] Cetirizine HCl (ZYRTEC PO) Take by mouth.    No Known Allergies  Past Medical History  Diagnosis Date  . Cystadenofibroma of ovary     Left ovary  . Arthritis   . Elevated cholesterol   . Hypertension    ROS: Negative except as per HPI  BP 144/88  Pulse 60  Ht 5\' 5"  (1.651 m)  Wt 157 lb (71.215 kg)  BMI 26.13 kg/m2  PHYSICAL EXAM: Pt is alert and oriented, NAD HEENT: normal Neck: JVP - normal, carotids 2+= without bruits Lungs: CTA bilaterally CV: RRR without murmur or  gallop Abd: soft, NT, Positive BS, no hepatomegaly Ext: no C/C/E, distal pulses intact and equal Skin: warm/dry no rash  EKG:  06/20/2013: Sinus bradycardia 48 beats per minute, no significant ST or T-wave changes.  ASSESSMENT AND PLAN: 1. Coronary atherosclerosis, native vessel. The patient has good control of her cardiovascular risk factors. She has no anginal symptoms. She will continue on her current medical program. She would like to have a gated cardiac CTA to reassess her plaque burden. It has been over a decade since her last study. I reviewed the pros and cons of this. I don't think it will really change her therapy. She understands the potential risks of a contrast reaction and radiation exposure involved. She is willing to pay out of pocket. She will call back if she decides to pursue this further.  2. Hyperlipidemia. The patient is on Crestor 20 mg with dose limitation because of side effects. Managed by Dr Renne Crigler.  3. HTN - BP controlled. Takes elevated blood pressure today is related to being in a big hurry to get here. She reports home blood pressures on average 130/70.  Tonny Bollman 07/18/2013 9:46 AM

## 2013-07-18 NOTE — Patient Instructions (Signed)
Your physician wants you to follow-up in: 1 YEAR with Dr Cooper.  You will receive a reminder letter in the mail two months in advance. If you don't receive a letter, please call our office to schedule the follow-up appointment.  Your physician recommends that you continue on your current medications as directed. Please refer to the Current Medication list given to you today.  

## 2014-02-21 ENCOUNTER — Other Ambulatory Visit: Payer: Self-pay | Admitting: Internal Medicine

## 2014-02-21 DIAGNOSIS — R221 Localized swelling, mass and lump, neck: Secondary | ICD-10-CM

## 2014-03-01 ENCOUNTER — Ambulatory Visit
Admission: RE | Admit: 2014-03-01 | Discharge: 2014-03-01 | Disposition: A | Discharge status: Home / Self Care | Payer: Medicare Other | Source: Ambulatory Visit | Attending: Internal Medicine | Admitting: Internal Medicine

## 2014-03-01 DIAGNOSIS — R221 Localized swelling, mass and lump, neck: Secondary | ICD-10-CM

## 2014-06-07 ENCOUNTER — Encounter: Payer: Self-pay | Admitting: Gynecology

## 2014-06-25 ENCOUNTER — Ambulatory Visit (INDEPENDENT_AMBULATORY_CARE_PROVIDER_SITE_OTHER): Payer: Medicare Other | Admitting: Cardiovascular Disease

## 2014-06-25 ENCOUNTER — Encounter: Payer: Self-pay | Admitting: Cardiovascular Disease

## 2014-06-25 VITALS — BP 118/72 | HR 50 | Ht 65.0 in | Wt 153.0 lb

## 2014-06-25 DIAGNOSIS — I2510 Atherosclerotic heart disease of native coronary artery without angina pectoris: Secondary | ICD-10-CM

## 2014-06-25 NOTE — Progress Notes (Signed)
   Background: the patient is followed for asymptomatic coronary artery disease, diagnosed on a gated cardiac CTA many years ago. She has also been followed for hypertension and hyperlipidemia.  HPI:  68 year-old woman presenting for follow-up evaluation, last seen one year ago.she continues to do well. She participates in regular exercise. She denies exertional symptoms. She specifically denies chest pain, chest pressure, shortness of breath, lightheadedness, or heart palpitations.   Outpatient Encounter Prescriptions as of 06/25/2014  Medication Sig  . aspirin 81 MG tablet Take 81 mg by mouth every other day.  . Cyanocobalamin (VITAMIN B 12 PO) Take by mouth.    Marland Kitchen MAGNESIUM PO Take by mouth.    . METOPROLOL TARTRATE PO Take 40 mg by mouth daily.   . rosuvastatin (CRESTOR) 20 MG tablet Take 20 mg by mouth daily.  Marland Kitchen telmisartan-hydrochlorothiazide (MICARDIS HCT) 80-12.5 MG per tablet Take 1 tablet by mouth daily.    . [DISCONTINUED] fish oil-omega-3 fatty acids 1000 MG capsule Take 1 g by mouth daily.    . [DISCONTINUED] rosuvastatin (CRESTOR) 40 MG tablet Take 20 mg by mouth daily.     No Known Allergies  Past Medical History  Diagnosis Date  . Cystadenofibroma of ovary     Left ovary  . Arthritis   . Elevated cholesterol   . Hypertension     family history includes Cancer in her mother; Heart disease in her father and paternal grandfather; Hypertension in her father; Stroke in her paternal grandfather.   ROS: Negative except as per HPI  BP 118/72 mmHg  Pulse 50  Ht 5\' 5"  (1.651 m)  Wt 153 lb (69.4 kg)  BMI 25.46 kg/m2  PHYSICAL EXAM: Pt is alert and oriented, NAD HEENT: normal Neck: JVP - normal, carotids 2+= without bruits Lungs: CTA bilaterally CV: bradycardic and regular without murmur or gallop Abd: soft, NT, Positive BS, no hepatomegaly Ext: no C/C/E, distal pulses intact and equal Skin: warm/dry no rash  EKG:  Sinus bradycardia 50 bpm, otherwise within normal  limits.  ASSESSMENT AND PLAN: 1. CAD, native vessel, without symptoms of angina. The patient is on a good risk-reduction program. She exercises regularly without symptoms. She's never had a coronary event. Recommended ongoing medical management. She would like to have a gated cardiac CTA, and understands this won't be covered by insurance. We discussed relative risks of contrast administration and radiation dose. She will call if she decides to schedule and I will arrange the study at that time.  2. HTN, essential: BP well-controlled. Continue metoprolol, telmisartan, and HCTZ.  3. Hyperlipidemia: on crestor 20 mg daily. Lipids followed by PCP. Continue same.  Sherren Mocha, MD 06/25/2014 1:54 PM

## 2014-06-25 NOTE — Patient Instructions (Signed)
Your physician recommends that you continue on your current medications as directed. Please refer to the Current Medication list given to you today.  Your physician wants you to follow-up in: 1 YEAR with Dr Burt Knack.  You will receive a reminder letter in the mail two months in advance. If you don't receive a letter, please call our office to schedule the follow-up appointment.  Please call the office if you would like to schedule a gated Cardiac CT.

## 2014-07-18 ENCOUNTER — Encounter: Payer: Self-pay | Admitting: Gynecology

## 2014-07-19 ENCOUNTER — Ambulatory Visit (INDEPENDENT_AMBULATORY_CARE_PROVIDER_SITE_OTHER): Payer: Medicare Other | Admitting: Gynecology

## 2014-07-19 ENCOUNTER — Other Ambulatory Visit (HOSPITAL_COMMUNITY)
Admission: RE | Admit: 2014-07-19 | Discharge: 2014-07-19 | Disposition: A | Discharge status: Home / Self Care | Payer: Medicare Other | Source: Ambulatory Visit | Attending: Gynecology | Admitting: Gynecology

## 2014-07-19 ENCOUNTER — Encounter: Payer: Self-pay | Admitting: Gynecology

## 2014-07-19 VITALS — BP 120/74 | Ht 65.0 in | Wt 152.0 lb

## 2014-07-19 DIAGNOSIS — Z124 Encounter for screening for malignant neoplasm of cervix: Secondary | ICD-10-CM

## 2014-07-19 DIAGNOSIS — Z01419 Encounter for gynecological examination (general) (routine) without abnormal findings: Secondary | ICD-10-CM

## 2014-07-19 DIAGNOSIS — N952 Postmenopausal atrophic vaginitis: Secondary | ICD-10-CM

## 2014-07-19 NOTE — Progress Notes (Signed)
Bianca [name] December 08, 1945 [id number]        68 y.o.  G0P0 for Follow up exam.  Several issues noted below  Past medical history,surgical history, problem list, medications, allergies, family history and social history were all reviewed and documented as reviewed in the EPIC chart.  ROS:  12 system ROS performed with pertinent positives and negatives included in the history, assessment and plan.   Additional significant findings :  Significant vaginal atrophy   Exam: Kim assistant Filed Vitals:   07/19/14 1509  BP: 120/74  Height: 5\' 5"  (1.651 m)  Weight: 152 lb (68.947 kg)   General appearance:  Normal affect, orientation and appearance. Skin: Grossly normal HEENT: Without gross lesions.  No cervical or supraclavicular adenopathy. Thyroid normal.  Lungs:  Clear without wheezing, rales or rhonchi Cardiac: RR, without RMG Abdominal:  Soft, nontender, without masses, guarding, rebound, organomegaly or hernia Breasts:  Examined lying and sitting without masses, retractions, discharge or axillary adenopathy. Pelvic:  Ext/BUS/vagina with significant vaginal atrophy, admits one finger  Cervix with significant atrophy  Uterus anteverted, normal size, shape and contour, midline and mobile nontender   Adnexa  Without masses or tenderness    Anus and perineum  Normal   Rectovaginal  Normal sphincter tone without palpated masses or tenderness.    Assessment/Plan:  68 y.o. G0P0 female for follow up exam.   1. Postmenopausal/atrophic genital changes. Patient with significant vaginal atrophy. Has had this for years to the point now where she is not sexually active. Does not seem overly bothered on a daily basis. Options for management include vaginal estrogen reviewed and declined. No history of vaginal bleeding. No other significant flushes or night sweats. Continue to monitor. Report any vaginal bleeding. 2. Mammography 2015. Continue with annual mammography. SBE monthly reviewed. 3. Pap  smear 2012. Pap smear done today. No history of significant abnormal Pap smears previously. Options to stop screening altogether or less frequent screening intervals reviewed. Will readdress on an annual basis. 4. DEXA 2013. Does this through Dr. Pennie Banter office.  Increase calcium vitamin D reviewed. Continue to follow up with him in reference to her bone health. 5. Colonoscopy 2012. Repeat at their recommended interval. 6. Health maintenance. No routine blood work done as this is done through her primary physician's office. Follow up in one year, sooner as needed.     [name] MD, 3:37 PM 07/19/2014

## 2014-07-23 LAB — CYTOLOGY - PAP

## 2014-09-26 DIAGNOSIS — M25561 Pain in right knee: Secondary | ICD-10-CM | POA: Diagnosis not present

## 2014-10-03 DIAGNOSIS — M25561 Pain in right knee: Secondary | ICD-10-CM | POA: Diagnosis not present

## 2014-10-14 DIAGNOSIS — M1711 Unilateral primary osteoarthritis, right knee: Secondary | ICD-10-CM | POA: Diagnosis not present

## 2014-10-14 DIAGNOSIS — M7121 Synovial cyst of popliteal space [Baker], right knee: Secondary | ICD-10-CM | POA: Diagnosis not present

## 2014-10-14 DIAGNOSIS — S83281A Other tear of lateral meniscus, current injury, right knee, initial encounter: Secondary | ICD-10-CM | POA: Diagnosis not present

## 2014-10-14 DIAGNOSIS — M94261 Chondromalacia, right knee: Secondary | ICD-10-CM | POA: Diagnosis not present

## 2015-02-04 DIAGNOSIS — L708 Other acne: Secondary | ICD-10-CM | POA: Diagnosis not present

## 2015-02-04 DIAGNOSIS — D2339 Other benign neoplasm of skin of other parts of face: Secondary | ICD-10-CM | POA: Diagnosis not present

## 2015-02-25 DIAGNOSIS — J358 Other chronic diseases of tonsils and adenoids: Secondary | ICD-10-CM | POA: Diagnosis not present

## 2015-02-25 DIAGNOSIS — H6122 Impacted cerumen, left ear: Secondary | ICD-10-CM | POA: Diagnosis not present

## 2015-02-25 DIAGNOSIS — K219 Gastro-esophageal reflux disease without esophagitis: Secondary | ICD-10-CM | POA: Diagnosis not present

## 2015-02-25 DIAGNOSIS — R1319 Other dysphagia: Secondary | ICD-10-CM | POA: Diagnosis not present

## 2015-02-25 DIAGNOSIS — Z87891 Personal history of nicotine dependence: Secondary | ICD-10-CM | POA: Diagnosis not present

## 2015-04-01 ENCOUNTER — Telehealth: Payer: Self-pay | Admitting: *Deleted

## 2015-04-01 NOTE — Telephone Encounter (Signed)
Pt called and left message regarding facial hair. I called pt back and asked her to call me.

## 2015-04-02 ENCOUNTER — Other Ambulatory Visit: Payer: Self-pay | Admitting: Gynecology

## 2015-04-02 NOTE — Telephone Encounter (Signed)
I would recommend follow up appointment with dermatology. They do have certain creams that they use to help with facial hair.

## 2015-04-02 NOTE — Telephone Encounter (Signed)
Thank you for clarifying.

## 2015-04-02 NOTE — Telephone Encounter (Signed)
Patient called complaining of increased facial hair "feel like a man".  She said she has had some laser treatment and wondered if that made it worse. She is asking is there anything you can recommend?

## 2015-04-02 NOTE — Telephone Encounter (Signed)
Patient informed.  Dr. Loetta Rough, in our conversation patient mentioned that she had her ovaries removed 3-4 years ago. She said you assisted Dr. Darnell Level with it and you later told her she had a tumor on her ovary when it was removed.  I do not see note available in chart. However, your exam last year reflected the adnexal exam "Adnexa Without masses or tenderness".  Just wanted to make you aware.

## 2015-04-02 NOTE — Telephone Encounter (Signed)
The adnexa is the area beside the uterus on either side. Whether or not she had a salpingo-oophorectomy does not alter your description of this area. Her exam showed no masses in the adnexa.

## 2015-08-12 DIAGNOSIS — Z0000 Encounter for general adult medical examination without abnormal findings: Secondary | ICD-10-CM | POA: Diagnosis not present

## 2015-08-12 DIAGNOSIS — I10 Essential (primary) hypertension: Secondary | ICD-10-CM | POA: Diagnosis not present

## 2015-08-12 DIAGNOSIS — E042 Nontoxic multinodular goiter: Secondary | ICD-10-CM | POA: Diagnosis not present

## 2015-08-14 DIAGNOSIS — Z0000 Encounter for general adult medical examination without abnormal findings: Secondary | ICD-10-CM | POA: Diagnosis not present

## 2015-08-19 ENCOUNTER — Encounter: Payer: Self-pay | Admitting: Cardiovascular Disease

## 2015-08-20 ENCOUNTER — Ambulatory Visit (INDEPENDENT_AMBULATORY_CARE_PROVIDER_SITE_OTHER): Payer: Medicare Other | Admitting: Cardiovascular Disease

## 2015-08-20 ENCOUNTER — Encounter: Payer: Self-pay | Admitting: Cardiovascular Disease

## 2015-08-20 VITALS — BP 122/72 | HR 50 | Ht 65.0 in | Wt 149.0 lb

## 2015-08-20 DIAGNOSIS — I2510 Atherosclerotic heart disease of native coronary artery without angina pectoris: Secondary | ICD-10-CM

## 2015-08-20 NOTE — Progress Notes (Signed)
Cardiology Office Note Date:  08/20/2015   ID:  Bianca [name], DOB Feb 08, 1946, MRN [id number]  PCP:  Horatio Pel, MD  Cardiologist:  Sherren Mocha, MD    Chief Complaint  Patient presents with  . Follow-up    CAD, Family Hx premature CAD   History of Present Illness: Bianca [name] is a 70 y.o. female who presents for  Follow-up of coronary artery disease, hypertension, and hyperlipidemia. CAD was diagnosed on a gated cardiac CTA several years ago.  The patient remains asymptomatic. She is physically active with regular exercise and denies any exertional symptoms. She recently had symptoms of bronchitis, but is improving. Today, she denies symptoms of palpitations, chest pain, shortness of breath, orthopnea, PND, lower extremity edema, dizziness, or syncope.  Past Medical History  Diagnosis Date  . Arthritis   . Elevated cholesterol   . Hypertension     Past Surgical History  Procedure Laterality Date  . Pelvic laparoscopy      DL with tubal lavage  . Anterior cruciate ligament repair    . Abdominal surgery  1988    Exp. Laparotomy -Micro tuboplasty  . Hemorrdectomy    . Cosmetic surgery    . Oophorectomy  2009    BSO cystadenofibroma    Current Outpatient Prescriptions  Medication Sig Dispense Refill  . aspirin 81 MG tablet Take 81 mg by mouth every other day.    . cefdinir (OMNICEF) 300 MG capsule TAKE ONE CAPSULE BY MOUTH EVERY 12 HOURS FOR 10 DAYS  0  . Cholecalciferol (VITAMIN D PO) Take 1,000 Units by mouth daily.     . Cyanocobalamin (VITAMIN B 12 PO) Take 1,000 mcg by mouth daily.     Marland Kitchen Dextromethorphan-Guaifenesin (MUCINEX DM PO) Take 1 tablet by mouth every 12 (twelve) hours as needed (FOR COUGH AND CONGESTION).    Marland Kitchen metoprolol succinate (TOPROL-XL) 50 MG 24 hr tablet TAKE 1/2 TO 1 TABLET BY MOUTH EVERY MORNING  2  . POTASSIUM BICARBONATE PO Take 550 mg by mouth daily.    Marland Kitchen pyridoxine (B-6) 100 MG tablet Take 100 mg by mouth daily.    .  rosuvastatin (CRESTOR) 20 MG tablet Take 20 mg by mouth daily.    Marland Kitchen telmisartan (MICARDIS) 80 MG tablet Take 80 mg by mouth daily.  2  . telmisartan-hydrochlorothiazide (MICARDIS HCT) 80-12.5 MG per tablet Take 1 tablet by mouth daily.      . vitamin C (ASCORBIC ACID) 500 MG tablet Take 500 mg by mouth daily.    . vitamin E 400 UNIT capsule Take 400 Units by mouth daily.     No current facility-administered medications for this visit.    Allergies:   Review of patient's allergies indicates no known allergies.   Social History:  The patient  reports that she has quit smoking. She does not have any smokeless tobacco history on file. She reports that she drinks about 1.2 oz of alcohol per week.   Family History:  The patient's  family history includes Cancer in her mother; Heart disease in her father and paternal grandfather; Hypertension in her father; Stroke in her paternal grandfather.    ROS:  Please see the history of present illness.  Otherwise, review of systems is positive for cough, joint swelling.  All other systems are reviewed and negative.    PHYSICAL EXAM: VS:  BP 122/72 mmHg  Pulse 50  Ht 5\' 5"  (1.651 m)  Wt 149 lb (67.586 kg)  BMI 24.79 kg/m2 ,  BMI Body mass index is 24.79 kg/(m^2). GEN: Well nourished, well developed, in no acute distress HEENT: normal Neck: no JVD, no masses. No carotid bruits Cardiac: RRR without murmur or gallop                Respiratory:  clear to auscultation bilaterally, normal work of breathing GI: soft, nontender, nondistended, + BS MS: no deformity or atrophy Ext: no pretibial edema, pedal pulses 2+= bilaterally Skin: warm and dry, no rash Neuro:  Strength and sensation are intact Psych: euthymic mood, full affect  EKG:  EKG is ordered today. The ekg ordered today shows sinus bradycardia 50 bpm , otherwise within normal limits.  Recent Labs: No results found for requested labs within last 365 days.   Lipid Panel     Component Value  Date/Time   CHOL  12/06/2010 0840    147        ATP III CLASSIFICATION:  <200     mg/dL   Desirable  200-239  mg/dL   Borderline High  >=240    mg/dL   High          TRIG 86 12/06/2010 0840   HDL 63 12/06/2010 0840   CHOLHDL 2.3 12/06/2010 0840   VLDL 17 12/06/2010 0840   LDLCALC  12/06/2010 0840    67        Total Cholesterol/HDL:CHD Risk Coronary Heart Disease Risk Table                     Men   Women  1/2 Average Risk   3.4   3.3  Average Risk       5.0   4.4  2 X Average Risk   9.6   7.1  3 X Average Risk  23.4   11.0        Use the calculated Patient Ratio above and the CHD Risk Table to determine the patient's CHD Risk.        ATP III CLASSIFICATION (LDL):  <100     mg/dL   Optimal  100-129  mg/dL   Near or Above                    Optimal  130-159  mg/dL   Borderline  160-189  mg/dL   High  >190     mg/dL   Very High      Wt Readings from Last 3 Encounters:  08/20/15 149 lb (67.586 kg)  07/19/14 152 lb (68.947 kg)  06/25/14 153 lb (69.4 kg)    ASSESSMENT AND PLAN: 1.   CAD, native vessel, without angina: The patient is stable with no symptoms. Her CAD was diagnosed by coronary CT angiography and she was noted to have mild nonobstructive disease when the study was done several years ago. She continues on appropriate risk reduction.   2. Essential hypertension: Blood pressure is well controlled on a combination of metoprolol and micardis/HCTZ   3. Hyperlipidemia: Treated with Crestor 20 mg. Most recent labs reviewed and at goal.  Current medicines are reviewed with the patient today.  The patient does not have concerns regarding medicines.  Labs/ tests ordered today include:   Orders Placed This Encounter  Procedures  . EKG 12-Lead    Disposition:   FU one year  Signed, Sherren Mocha, MD  08/20/2015 11:22 AM    [facility] [address] [place]  [address] Phone: [phone]; Fax: [phone]

## 2015-08-20 NOTE — Patient Instructions (Signed)

## 2015-09-08 ENCOUNTER — Ambulatory Visit (INDEPENDENT_AMBULATORY_CARE_PROVIDER_SITE_OTHER): Payer: Medicare Other | Admitting: Gynecology

## 2015-09-08 ENCOUNTER — Encounter: Payer: Self-pay | Admitting: Gynecology

## 2015-09-08 VITALS — BP 120/74 | Ht 65.0 in | Wt 149.0 lb

## 2015-09-08 DIAGNOSIS — N952 Postmenopausal atrophic vaginitis: Secondary | ICD-10-CM

## 2015-09-08 DIAGNOSIS — Z01419 Encounter for gynecological examination (general) (routine) without abnormal findings: Secondary | ICD-10-CM

## 2015-09-08 NOTE — Patient Instructions (Signed)

## 2015-09-08 NOTE — Addendum Note (Signed)
Addended by: Joaquin Music on: 09/08/2015 04:17 PM   Modules accepted: Orders

## 2015-09-08 NOTE — Progress Notes (Signed)
Bianca [name] [birth year] [id number]        70 y.o.  G0P0  for rest and pelvic exam.  Past medical history,surgical history, problem list, medications, allergies, family history and social history were all reviewed and documented as reviewed in the EPIC chart.  ROS:  Performed with pertinent positives and negatives included in the history, assessment and plan.   Additional significant findings :  none   Exam: [name] assistant Filed Vitals:   09/08/15 1514  BP: 120/74  Height: 5\' 5"  (1.651 m)  Weight: 149 lb (67.586 kg)   General appearance:  Normal affect, orientation and appearance. Skin: Grossly normal HEENT: Without gross lesions.  No cervical or supraclavicular adenopathy. Thyroid normal.  Lungs:  Clear without wheezing, rales or rhonchi Cardiac: RR, without RMG Abdominal:  Soft, nontender, without masses, guarding, rebound, organomegaly or hernia Breasts:  Examined lying and sitting without masses, retractions, discharge or axillary adenopathy. Pelvic:  Ext/BUS/vagina with atrophic changes  Cervix with atrophic changes  Uterus anteverted, normal size, shape and contour, midline and mobile nontender   Adnexa  Without masses or tenderness    Anus and perineum  Normal   Rectovaginal  Normal sphincter tone without palpated masses or tenderness.    Assessment/Plan:  70 y.o. G0P0 female for breast and pelvic exam.   1. Postmenopausal/atrophic genital changes. Without significant hot flashes or night sweats. Does have issues with vaginal dryness but this is not an issue as she is not sexually active.  No vaginal bleeding. Continue to monitor report any issues or vaginal bleeding.  2. Mammography 06/2014. Recommend follow up mammogram now and she agrees to call and arrange. SBE monthly reviewed. 3. Pap smear 07/2014. No Pap smear done today. No history of significant abnormal Pap smears previously. 4. DEXA reported 2013 at Dr. Pennie Banter office. We'll continue to follow up with  him for bone health. 5. Colonoscopy 2012. Repeat at their recommended interval. 6. Health maintenance. No routine blood work done as this is done through her primary physician's office. Follow up 1 year, sooner as needed.   [name] MD, 3:44 PM 09/08/2015

## 2015-10-16 DIAGNOSIS — J309 Allergic rhinitis, unspecified: Secondary | ICD-10-CM | POA: Diagnosis not present

## 2015-12-23 DIAGNOSIS — Z1231 Encounter for screening mammogram for malignant neoplasm of breast: Secondary | ICD-10-CM | POA: Diagnosis not present

## 2016-02-09 DIAGNOSIS — E7800 Pure hypercholesterolemia, unspecified: Secondary | ICD-10-CM | POA: Diagnosis not present

## 2016-02-11 ENCOUNTER — Encounter: Payer: Self-pay | Admitting: Cardiovascular Disease

## 2016-02-11 DIAGNOSIS — E7800 Pure hypercholesterolemia, unspecified: Secondary | ICD-10-CM | POA: Diagnosis not present

## 2016-02-11 DIAGNOSIS — I10 Essential (primary) hypertension: Secondary | ICD-10-CM | POA: Diagnosis not present

## 2016-03-02 DIAGNOSIS — K5520 Angiodysplasia of colon without hemorrhage: Secondary | ICD-10-CM | POA: Diagnosis not present

## 2016-03-02 DIAGNOSIS — Z86010 Personal history of colonic polyps: Secondary | ICD-10-CM | POA: Diagnosis not present

## 2016-03-02 DIAGNOSIS — D126 Benign neoplasm of colon, unspecified: Secondary | ICD-10-CM | POA: Diagnosis not present

## 2016-03-02 DIAGNOSIS — D122 Benign neoplasm of ascending colon: Secondary | ICD-10-CM | POA: Diagnosis not present

## 2016-03-04 DIAGNOSIS — R918 Other nonspecific abnormal finding of lung field: Secondary | ICD-10-CM | POA: Diagnosis not present

## 2016-03-04 DIAGNOSIS — R05 Cough: Secondary | ICD-10-CM | POA: Diagnosis not present

## 2016-03-04 DIAGNOSIS — I10 Essential (primary) hypertension: Secondary | ICD-10-CM | POA: Diagnosis not present

## 2016-03-04 DIAGNOSIS — J4521 Mild intermittent asthma with (acute) exacerbation: Secondary | ICD-10-CM | POA: Diagnosis not present

## 2016-03-10 ENCOUNTER — Encounter: Payer: Self-pay | Admitting: Pulmonary Disease

## 2016-03-10 ENCOUNTER — Ambulatory Visit (INDEPENDENT_AMBULATORY_CARE_PROVIDER_SITE_OTHER): Payer: Medicare Other | Admitting: Pulmonary Disease

## 2016-03-10 ENCOUNTER — Other Ambulatory Visit: Payer: Medicare Other

## 2016-03-10 VITALS — BP 138/80 | HR 64

## 2016-03-10 DIAGNOSIS — J209 Acute bronchitis, unspecified: Secondary | ICD-10-CM | POA: Diagnosis not present

## 2016-03-10 DIAGNOSIS — J849 Interstitial pulmonary disease, unspecified: Secondary | ICD-10-CM | POA: Diagnosis not present

## 2016-03-10 DIAGNOSIS — J8409 Other alveolar and parieto-alveolar conditions: Secondary | ICD-10-CM

## 2016-03-10 NOTE — Assessment & Plan Note (Signed)
High-resolution CT chest-without contrast to clarify -not impressed with interstitial prominence on chest x-ray

## 2016-03-10 NOTE — Assessment & Plan Note (Signed)
Blood work for allergy testing  Take Zyrtec daily Use store brand of Sudafed (eg walphed ) daily for 2 weeks to dry out  Sinuses Use Mucinex DM twice daily Use albuterol 2 puffs as needed for wheezing  Call back in 1 week if no better

## 2016-03-10 NOTE — Progress Notes (Signed)
   Subjective:    Patient ID: Bianca [name], female    DOB: 12/17/1945, 70 y.o.   MRN: [id number]  HPI    Review of Systems  Constitutional: Negative for chills, fever and unexpected weight change.  HENT: Positive for postnasal drip, sinus pressure and sore throat. Negative for congestion, dental problem, ear pain, nosebleeds, rhinorrhea, sneezing, trouble swallowing and voice change.   Eyes: Negative for visual disturbance.  Respiratory: Positive for cough, chest tightness and shortness of breath.   Cardiovascular: Negative for chest pain and leg swelling.  Gastrointestinal: Negative for abdominal pain, diarrhea and vomiting.  Genitourinary: Negative for difficulty urinating.  Musculoskeletal: Negative for arthralgias.  Skin: Negative for rash.  Neurological: Negative for tremors, syncope and headaches.  Hematological: Does not bruise/bleed easily.       Objective:   Physical Exam        Assessment & Plan:

## 2016-03-10 NOTE — Patient Instructions (Signed)
High-resolution CT chest-without contrast to get a better picture of the lungs Blood work for allergy testing  Take Zyrtec daily Use store brand of Sudafed (eg walphed ) daily for 2 weeks to dry out  Sinuses Use Mucinex DM twice daily Use albuterol 2 puffs as needed for wheezing  Call back in 1 week if no better

## 2016-03-10 NOTE — Progress Notes (Signed)
Subjective:    Patient ID: CHRISTOPHER GIANG, female    DOB: 27-Jan-1946, 70 y.o.   MRN: [id number]  HPI Chief Complaint  Patient presents with  . Pulmonary Consult    Referred by Dr. Shelia Media: chronic bronchitis, coughing up clear mucus, chest congestion, sinus drainage, sinus congestion.     70 -year-old remote smoker presents for evaluation of abnormal chest x-ray. 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 She smoked a pack per day since her teenage years until age of 40-about 35 pack years. about once a year she gets an attack of bronchitis and generally resolves with antibiotics. This year it has lasted about 5 weeks, she was initially given Cefdinir, then given doxycycline and prednisone about a week ago with albuterol MDI. Chest x-ray showed interstitial prominence I have reviewed  These images. Also reviewed old chest x-ray from 2012 which appears clear with mild hyperinflation.  She denies dyspnea or wheezing. She reports constant sinus drip for which she has taken Zyrtec without relief. Her main complaint right now has cough with minimal white sputum production. She denies pedal edema or orthopnea. She has never been formally tested for allergies-she has 2 cats at home.  Past medical history otherwise includes hyperlipidemia and multinodular goiter with GERD that is controlled without medications and hypertension that is well controlled.    Past Medical History:  Diagnosis Date  . Arthritis   . Elevated cholesterol   . Hypertension      Past Surgical History:  Procedure Laterality Date  . ABDOMINAL SURGERY  1988   Exp. Laparotomy -Micro tuboplasty  . ANTERIOR CRUCIATE LIGAMENT REPAIR    . COSMETIC SURGERY    . hemorrdectomy    . OOPHORECTOMY  2009   BSO cystadenofibroma  . PELVIC LAPAROSCOPY     DL with tubal lavage     No Known Allergies   Social History   Social History  . Marital status: Married     Spouse name: N/A  . Number of children: N/A  . Years of education: N/A   Occupational History  . Not on file.   Social History Main Topics  . Smoking status: Former Research scientist (life sciences)  . Smokeless tobacco: Not on file  . Alcohol use 0.0 oz/week     Comment: Rare  . Drug use: No  . Sexual activity: No     Comment: 1st intercourse 70 yo-Fewer than 5 partners   Other Topics Concern  . Not on file   Social History Narrative  . No narrative on file   Family History  Problem Relation Age of Onset  . Cancer Mother     Pancreatic cancer  . Hypertension Father   . Heart disease Father   . Heart disease Paternal Grandfather   . Stroke Paternal Grandfather       Review of Systems neg for any significant sore throat, dysphagia, itching, sneezing, nasal congestion or excess/ purulent secretions, fever, chills, sweats, unintended wt loss, pleuritic or exertional cp, hempoptysis, orthopnea pnd or change in chronic leg swelling. Also denies presyncope, palpitations, heartburn, abdominal pain, nausea, vomiting, diarrhea or change in bowel or urinary habits, dysuria,hematuria, rash, arthralgias, visual complaints, headache, numbness weakness or ataxia.     Objective:   Physical Exam  Gen. Pleasant, well-nourished, in no distress, normal affect ENT - no lesions, no post nasal drip Neck: No JVD, no thyromegaly, no carotid bruits Lungs:  no use of accessory muscles, no dullness to percussion, clear without rales or rhonchi  Cardiovascular: Rhythm regular, heart sounds  normal, no murmurs or gallops, no peripheral edema Abdomen: soft and non-tender, no hepatosplenomegaly, BS normal. Musculoskeletal: No deformities, no cyanosis or clubbing Neuro:  alert, non focal       Assessment & Plan:

## 2016-03-11 LAB — RESPIRATORY ALLERGY PROFILE REGION II ~~LOC~~
Allergen, Comm Silver Birch, t9: <0.1 kU/L
Allergen, Mulberry, t76: <0.1 kU/L
Aspergillus fumigatus, m3: <0.1 kU/L
Box Elder IgE: <0.1 kU/L
Cladosporium Herbarum: <0.1 kU/L
Cockroach: <0.1 kU/L
Common Ragweed: <0.1 kU/L
IgE (Immunoglobulin E), Serum: <2 kU/L (ref <115)
Penicillium Notatum: <0.1 kU/L
Rough Pigweed  IgE: <0.1 kU/L
Sheep Sorrel IgE: <0.1 kU/L

## 2016-03-16 ENCOUNTER — Telehealth: Payer: Self-pay | Admitting: Pulmonary Disease

## 2016-03-16 NOTE — Telephone Encounter (Signed)
Pt already given results since this msg taken  Will close encounter

## 2016-03-17 ENCOUNTER — Ambulatory Visit (INDEPENDENT_AMBULATORY_CARE_PROVIDER_SITE_OTHER)
Admission: RE | Admit: 2016-03-17 | Discharge: 2016-03-17 | Disposition: A | Discharge status: Home / Self Care | Payer: Medicare Other | Source: Ambulatory Visit | Attending: Pulmonary Disease | Admitting: Pulmonary Disease

## 2016-03-17 DIAGNOSIS — J849 Interstitial pulmonary disease, unspecified: Secondary | ICD-10-CM | POA: Diagnosis not present

## 2016-03-17 DIAGNOSIS — R05 Cough: Secondary | ICD-10-CM | POA: Diagnosis not present

## 2016-03-22 ENCOUNTER — Telehealth: Payer: Self-pay | Admitting: Pulmonary Disease

## 2016-03-22 ENCOUNTER — Other Ambulatory Visit: Payer: Self-pay | Admitting: Pulmonary Disease

## 2016-03-22 DIAGNOSIS — J849 Interstitial pulmonary disease, unspecified: Secondary | ICD-10-CM

## 2016-03-22 NOTE — Telephone Encounter (Signed)
Per HRCT result note:  CT Chest High Resolution  Order: [id number]  Status:  Final result Visible to patient:  No (Not Released) Dx:  ILD (interstitial lung disease) ([place])  Notes Recorded by Rinaldo Ratel, CMA on 03/22/2016 at 11:19 AM EDT Called spoke with patient and discussed CT results / recs as stated by RA. Pt will come this week for labs - orders only placed for lab orders. Pt is okay with beginning low dose steroids if RA thinks is necessary. Also, attempted to schedule appt in 2-4 weeks, but there are no openings. Will route as phone note to RA and Sharyn Lull to address. ------  Notes Recorded by Len Blalock, CMA on 03/18/2016 at 11:35 AM EDT lmtcb for pt ------  Notes Recorded by Rigoberto Noel, MD on 03/17/2016 at 4:15 PM EDT Evidence of mild inflammation in the lungs Will need some more blood work to investigate the cause- ANA, ESR, CCP Can treat with low dose steroids if she has persistent symptoms without improvement -please arrange for follow-up appointment in 2-4 weeks   Labs orders placed Dr Elsworth Soho, please advise on low dose steroids.  Also, please advise on appt date/time. Thank you.

## 2016-03-24 ENCOUNTER — Other Ambulatory Visit: Payer: Medicare Other

## 2016-03-24 DIAGNOSIS — J849 Interstitial pulmonary disease, unspecified: Secondary | ICD-10-CM

## 2016-03-24 NOTE — Telephone Encounter (Signed)
Pt came to the office with additional questions regarding her CT results and recommendations Discussed results and recs again with patient as stated by RA.  Pt was concerned about cancer.  Advised pt that RA did not mention cancer in his interpretation, only the mild inflammation and labs to investigate that inflammation further.  Pt okay with this and voiced her understanding.  She is feeling better today, still producing clear mucus when she coughs Pt did ask again about the low dose steroids mentioned by RA, her 2 week follow up appt and would like to know if she may have something for her cough.  Did advise Delsym, but she requests RA's recommendation.  Will forward to RA and Sharyn Lull to ensure follow up.  Thank you.

## 2016-03-25 LAB — CYCLIC CITRUL PEPTIDE ANTIBODY, IGG: Cyclic Citrullin Peptide Ab: <16 Units

## 2016-03-25 LAB — RHEUMATOID FACTOR: Rhuematoid fact SerPl-aCnc: <10 IU/mL (ref <=14)

## 2016-03-25 LAB — ANA COMPREHENSIVE PANEL
Centromere Ab Screen: <0.2 AI (ref 0.0–0.9)
ENA RNP Ab: 0.3 AI (ref 0.0–0.9)
ENA SM Ab Ser-aCnc: <0.2 AI (ref 0.0–0.9)
dsDNA Ab: 1 IU/mL (ref 0–9)

## 2016-03-25 MED ORDER — PREDNISONE 10 MG PO TABS: ORAL_TABLET | ORAL | 0 refills | Status: DC

## 2016-03-25 NOTE — Telephone Encounter (Signed)
Left message to call back  

## 2016-03-25 NOTE — Telephone Encounter (Signed)
Pt aware of RA recommendations. rx sent to pt preferred pharmacy. Nothing further needed.

## 2016-03-25 NOTE — Telephone Encounter (Signed)
Pl make FU appt for- 8/28 3 -15 pm Prednisone 10 mg tabs  Take 2 tabs daily with food x 5ds, then 1 tab daily with food x 5ds then STOP  Wait on blood work results

## 2016-03-25 NOTE — Telephone Encounter (Signed)
Patient returning Michelle's call, CB is [phone].

## 2016-04-05 ENCOUNTER — Telehealth: Payer: Self-pay | Admitting: Pulmonary Disease

## 2016-04-05 NOTE — Telephone Encounter (Signed)
Attempted to call pt x2 Received busy tone

## 2016-04-06 NOTE — Telephone Encounter (Signed)
RA pt is calling requesting results of labs.  Please advise. thanks

## 2016-04-07 NOTE — Telephone Encounter (Signed)
Lab work neg for lupus group of diseases as cause of inflammation

## 2016-04-07 NOTE — Telephone Encounter (Signed)
Called and spoke with pt and she was concerned that she had not heard back about her ct results and her labs.  She stated that she is not used an office waiting so long to get back to her on these results.  Pt voiced her understanding of these results and nothing further is needed.

## 2016-05-06 ENCOUNTER — Encounter: Payer: Self-pay | Admitting: Pulmonary Disease

## 2016-05-06 ENCOUNTER — Ambulatory Visit (INDEPENDENT_AMBULATORY_CARE_PROVIDER_SITE_OTHER): Payer: Medicare Other | Admitting: Pulmonary Disease

## 2016-05-06 DIAGNOSIS — J849 Interstitial pulmonary disease, unspecified: Secondary | ICD-10-CM | POA: Diagnosis not present

## 2016-05-06 NOTE — Progress Notes (Signed)
   Subjective:    Patient ID: Bianca [name], female    DOB: [birth year], 70 y.o.   MRN: [id number]  HPI  70 -year-old remote smoker For follow-up of ILD  She presented with  abnormal chest x-ray showing interstitial prominencein 02/2016 and non-resolving bronchitis. 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 She smoked a pack per day since her teenage years until age of 40-about 34 pack years. - old chest x-ray from 2012 which appears clear with mild hyperinflation.   -she has 2 cats at home.  Past medical history otherwise includes hyperlipidemia and multinodular goiter with GERD that is controlled without medications and hypertension that is well controlled.   Chief Complaint  Patient presents with  . Interstitial Lung Disease   She reports constant sinus drip for which she has taken Zyrtec without relief. Her main complaint right now is cough with minimal white sputum production. We reviewed her imaging studies and blood work results   Significant tests/ events  HRCT 03/2016 >> patchy areas of ground-glass attenuation which are peripheral and peribronchovascular predominant, most evident throughout the lung bases bilaterally. ? NSIP vs early UIP  RAST neg ANA, CCP neg    Review of Systems neg for any significant sore throat, dysphagia, itching, sneezing, nasal congestion or excess/ purulent secretions, fever, chills, sweats, unintended wt loss, pleuritic or exertional cp, hempoptysis, orthopnea pnd or change in chronic leg swelling.   Also denies presyncope, palpitations, heartburn, abdominal pain, nausea, vomiting, diarrhea or change in bowel or urinary habits, dysuria,hematuria, rash, arthralgias, visual complaints, headache, numbness weakness or ataxia.     Objective:   Physical Exam  Gen. Pleasant, well-nourished, in no distress ENT - no lesions, no post nasal drip Neck: No JVD, no thyromegaly, no  carotid bruits Lungs: no use of accessory muscles, no dullness to percussion, bibasal fine rales, no rhonchi  Cardiovascular: Rhythm regular, heart sounds  normal, no murmurs or gallops, no peripheral edema Musculoskeletal: No deformities, no cyanosis or clubbing        Assessment & Plan:

## 2016-05-06 NOTE — Patient Instructions (Signed)
We discussed your scan findings and blood reports. You may have hypersensitivity pneumonitis from exposure or this may be early findings of pulmonary fibrosis It is hard to tell without a biopsy at this time.  Would suggest treating for sinusitis with a store brand Sudafed for 2 weeks  Obtain pulmonary function tests to establish baseline-we will call you with results  Call me if symptoms worse

## 2016-05-06 NOTE — Assessment & Plan Note (Signed)
We discussed your scan findings and blood reports. You may have hypersensitivity pneumonitis from exposure or this may be early findings of pulmonary fibrosis It is hard to tell without a biopsy at this time.  Would suggest treating for sinusitis with a store brand Sudafed for 2 weeks  Obtain pulmonary function tests to establish baseline-we will call you with results  Call me if symptoms worse

## 2016-05-10 ENCOUNTER — Ambulatory Visit (INDEPENDENT_AMBULATORY_CARE_PROVIDER_SITE_OTHER): Payer: Medicare Other | Admitting: Pulmonary Disease

## 2016-05-10 DIAGNOSIS — J849 Interstitial pulmonary disease, unspecified: Secondary | ICD-10-CM

## 2016-05-10 LAB — PULMONARY FUNCTION TEST
DL/VA % PRED: 55 %
DL/VA: 2.69 ml/min/mmHg/L
DLCO COR % PRED: 51 %
DLCO COR: 12.78 ml/min/mmHg
DLCO unc % pred: 47 %
DLCO unc: 11.68 ml/min/mmHg
FEF 25-75 POST: 2.68 L/s
FEF 25-75 Pre: 1.87 L/sec
FEF2575-%CHANGE-POST: 43 %
FEF2575-%PRED-POST: 141 %
FEF2575-%PRED-PRE: 98 %
FEV1-%Change-Post: 7 %
FEV1-%Pred-Post: 110 %
FEV1-%Pred-Pre: 102 %
FEV1-POST: 2.52 L
FEV1-Pre: 2.34 L
FEV1FVC-%CHANGE-POST: 1 %
FEV1FVC-%PRED-PRE: 100 %
FEV6-%Change-Post: 6 %
FEV6-%Pred-Post: 110 %
FEV6-%Pred-Pre: 104 %
FEV6-Post: 3.2 L
FEV6-Pre: 3.01 L
FEV6FVC-%Change-Post: 0 %
FEV6FVC-%Pred-Post: 104 %
FEV6FVC-%Pred-Pre: 103 %
FVC-%Change-Post: 5 %
FVC-%PRED-POST: 106 %
FVC-%PRED-PRE: 101 %
FVC-POST: 3.22 L
FVC-PRE: 3.06 L
POST FEV1/FVC RATIO: 78 %
PRE FEV1/FVC RATIO: 77 %
Post FEV6/FVC ratio: 99 %
Pre FEV6/FVC Ratio: 98 %
RV % pred: 88 %
RV: 1.96 L
TLC % PRED: 99 %
TLC: 5.06 L

## 2016-06-07 DIAGNOSIS — R0981 Nasal congestion: Secondary | ICD-10-CM | POA: Diagnosis not present

## 2016-06-22 DIAGNOSIS — J310 Chronic rhinitis: Secondary | ICD-10-CM | POA: Diagnosis not present

## 2016-06-22 DIAGNOSIS — J343 Hypertrophy of nasal turbinates: Secondary | ICD-10-CM | POA: Diagnosis not present

## 2016-06-22 DIAGNOSIS — J342 Deviated nasal septum: Secondary | ICD-10-CM | POA: Diagnosis not present

## 2016-07-27 DIAGNOSIS — J310 Chronic rhinitis: Secondary | ICD-10-CM | POA: Diagnosis not present

## 2016-07-27 DIAGNOSIS — J343 Hypertrophy of nasal turbinates: Secondary | ICD-10-CM | POA: Diagnosis not present

## 2016-07-27 DIAGNOSIS — J342 Deviated nasal septum: Secondary | ICD-10-CM | POA: Diagnosis not present

## 2016-08-17 ENCOUNTER — Encounter: Payer: Self-pay | Admitting: Cardiovascular Disease

## 2016-08-17 DIAGNOSIS — E7800 Pure hypercholesterolemia, unspecified: Secondary | ICD-10-CM | POA: Diagnosis not present

## 2016-08-17 DIAGNOSIS — E559 Vitamin D deficiency, unspecified: Secondary | ICD-10-CM | POA: Diagnosis not present

## 2016-08-17 DIAGNOSIS — Z79899 Other long term (current) drug therapy: Secondary | ICD-10-CM | POA: Diagnosis not present

## 2016-08-17 DIAGNOSIS — I10 Essential (primary) hypertension: Secondary | ICD-10-CM | POA: Diagnosis not present

## 2016-08-17 DIAGNOSIS — D649 Anemia, unspecified: Secondary | ICD-10-CM | POA: Diagnosis not present

## 2016-08-19 ENCOUNTER — Other Ambulatory Visit: Payer: Self-pay | Admitting: Internal Medicine

## 2016-08-19 DIAGNOSIS — J309 Allergic rhinitis, unspecified: Secondary | ICD-10-CM | POA: Diagnosis not present

## 2016-08-19 DIAGNOSIS — D126 Benign neoplasm of colon, unspecified: Secondary | ICD-10-CM | POA: Diagnosis not present

## 2016-08-19 DIAGNOSIS — E042 Nontoxic multinodular goiter: Secondary | ICD-10-CM | POA: Diagnosis not present

## 2016-08-19 DIAGNOSIS — Z0000 Encounter for general adult medical examination without abnormal findings: Secondary | ICD-10-CM | POA: Diagnosis not present

## 2016-08-19 DIAGNOSIS — Z780 Asymptomatic menopausal state: Secondary | ICD-10-CM | POA: Diagnosis not present

## 2016-08-19 DIAGNOSIS — E049 Nontoxic goiter, unspecified: Secondary | ICD-10-CM

## 2016-08-25 ENCOUNTER — Ambulatory Visit
Admission: RE | Admit: 2016-08-25 | Discharge: 2016-08-25 | Disposition: A | Discharge status: Home / Self Care | Payer: Medicare HMO | Source: Ambulatory Visit | Attending: Internal Medicine | Admitting: Internal Medicine

## 2016-08-25 DIAGNOSIS — E042 Nontoxic multinodular goiter: Secondary | ICD-10-CM | POA: Diagnosis not present

## 2016-08-25 DIAGNOSIS — E049 Nontoxic goiter, unspecified: Secondary | ICD-10-CM

## 2016-09-01 ENCOUNTER — Ambulatory Visit: Payer: Medicare Other | Admitting: Pulmonary Disease

## 2016-09-08 ENCOUNTER — Ambulatory Visit (INDEPENDENT_AMBULATORY_CARE_PROVIDER_SITE_OTHER): Payer: Medicare HMO | Admitting: Gynecology

## 2016-09-08 ENCOUNTER — Encounter: Payer: Self-pay | Admitting: Gynecology

## 2016-09-08 VITALS — BP 110/70 | Ht 64.0 in | Wt 149.0 lb

## 2016-09-08 DIAGNOSIS — Z01411 Encounter for gynecological examination (general) (routine) with abnormal findings: Secondary | ICD-10-CM

## 2016-09-08 DIAGNOSIS — N952 Postmenopausal atrophic vaginitis: Secondary | ICD-10-CM

## 2016-09-08 NOTE — Patient Instructions (Signed)

## 2016-09-08 NOTE — Progress Notes (Signed)
    Bianca [name] 18-Aug-1945 [id number]        71 y.o.  G0P0 for breast and pelvic exam.  Past medical history,surgical history, problem list, medications, allergies, family history and social history were all reviewed and documented as reviewed in the EPIC chart.  ROS:  Performed with pertinent positives and negatives included in the history, assessment and plan.   Additional significant findings :  None   Exam: Copywriter, advertising Vitals:   09/08/16 1457  BP: 110/70  Weight: 149 lb (67.6 kg)  Height: 5\' 4"  (1.626 m)   Body mass index is 25.58 kg/m.  General appearance:  Normal affect, orientation and appearance. Skin: Grossly normal HEENT: Without gross lesions.  No cervical or supraclavicular adenopathy. Thyroid normal.  Lungs:  Clear without wheezing, rales or rhonchi Cardiac: RR, without RMG Abdominal:  Soft, nontender, without masses, guarding, rebound, organomegaly or hernia Breasts:  Examined lying and sitting without masses, retractions, discharge or axillary adenopathy. Pelvic:  Ext, BUS, Vagina with atrophic changes  Cervix with atrophic changes  Uterus anteverted, normal size, shape and contour, midline and mobile nontender   Adnexa without masses or tenderness    Anus and perineum normal   Rectovaginal normal sphincter tone without palpated masses or tenderness.    Assessment/Plan:  71 y.o. G0P0 female for breast and pelvic exam.   1. Postmenopausal/atrophic genital changes. No significant hot flushes, night sweats, vaginal dryness or any vaginal bleeding. Continue to monitor report any issues or vaginal bleeding. 2. Pap smear 07/2014. No Pap smear done today. No history of abnormal Pap smears. Options to stop screening altogether per current screening guidelines based on age versus less frequent screening intervals reviewed. Will readdress on annual basis. 3. DEXA 2013 through Dr. Pennie Banter office. She reports planning to repeat this this coming year when she sees  him. She'll continue to follow up with them in reference to bone health. 4. Mammography reported 04/2016. I do not have a copy of this report but she states that she clearly remembers having it then. Continue with annual mammography when due. SBE monthly reviewed. 5. Colonoscopy 2012. Repeat at their recommended interval. 6. Health maintenance. No routine lab work done as this is done through Dr. Pennie Banter office. Follow up 1 year, sooner as needed.   [name] MD, 3:43 PM 09/08/2016

## 2016-09-10 DIAGNOSIS — J343 Hypertrophy of nasal turbinates: Secondary | ICD-10-CM | POA: Diagnosis not present

## 2016-09-10 DIAGNOSIS — J342 Deviated nasal septum: Secondary | ICD-10-CM | POA: Diagnosis not present

## 2016-09-10 DIAGNOSIS — J310 Chronic rhinitis: Secondary | ICD-10-CM | POA: Diagnosis not present

## 2016-09-14 ENCOUNTER — Other Ambulatory Visit (INDEPENDENT_AMBULATORY_CARE_PROVIDER_SITE_OTHER): Payer: Self-pay | Admitting: Otolaryngology

## 2016-09-14 DIAGNOSIS — J329 Chronic sinusitis, unspecified: Secondary | ICD-10-CM

## 2016-09-17 ENCOUNTER — Ambulatory Visit
Admission: RE | Admit: 2016-09-17 | Discharge: 2016-09-17 | Disposition: A | Discharge status: Home / Self Care | Payer: Medicare HMO | Source: Ambulatory Visit | Attending: Otolaryngology | Admitting: Otolaryngology

## 2016-09-17 DIAGNOSIS — J329 Chronic sinusitis, unspecified: Secondary | ICD-10-CM

## 2016-10-05 DIAGNOSIS — J310 Chronic rhinitis: Secondary | ICD-10-CM | POA: Diagnosis not present

## 2016-10-05 DIAGNOSIS — J343 Hypertrophy of nasal turbinates: Secondary | ICD-10-CM | POA: Diagnosis not present

## 2016-10-05 DIAGNOSIS — M810 Age-related osteoporosis without current pathological fracture: Secondary | ICD-10-CM | POA: Diagnosis not present

## 2016-10-05 DIAGNOSIS — J342 Deviated nasal septum: Secondary | ICD-10-CM | POA: Diagnosis not present

## 2016-10-08 ENCOUNTER — Encounter: Payer: Self-pay | Admitting: Pulmonary Disease

## 2016-10-08 ENCOUNTER — Ambulatory Visit (INDEPENDENT_AMBULATORY_CARE_PROVIDER_SITE_OTHER): Payer: Medicare HMO | Admitting: Pulmonary Disease

## 2016-10-08 DIAGNOSIS — J849 Interstitial pulmonary disease, unspecified: Secondary | ICD-10-CM

## 2016-10-08 NOTE — Assessment & Plan Note (Signed)
Schedule PFTs with next appointment High-resolution CT scan of the chest noncontrast in 6 months, prior to next appointment  A full to make her radiologic diagnosis based on disease progression

## 2016-10-08 NOTE — Patient Instructions (Signed)
Schedule PFTs with next appointment High-resolution CT scan of the chest noncontrast in 6 months, prior to next appointment

## 2016-10-08 NOTE — Progress Notes (Signed)
   Subjective:    Patient ID: Bianca [name], female    DOB: 01-Sep-1945, 71 y.o.   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 She smoked a pack per day since her teenage years until age of 40-about 22 pack years.    -she has 2 cats at home.  Past medical history otherwise includes hyperlipidemia and multinodular goiter with GERD that is controlled without medications and hypertension that is well controlled.   She presented with  abnormal chest x-ray showing interstitial prominencein 02/2016 and non-resolving bronchitis. - old chest x-ray from 2012  appears clear with mild hyperinflation.    Chief Complaint  Patient presents with  . Follow-up    f/u ILD, she is having surgery on her nose in March, she feels this was causing her breathing problems   She continues to have postnasal drip, scratchy throat and brings up minimal amounts of white to yellow phlegm in the mornings. She is seen ENT, CT sinuses showed a deviated septum to the left and surgery is planned in March  Her breathing is otherwise stable, no pedal edema or orthopnea  I reviewed the CT and PFTs  Significant tests/ events  HRCT 03/2016 >> patchy areas of ground-glass attenuation which are peripheral and peribronchovascular predominant, most evident throughout the lung bases bilaterally. ? NSIP vs early UIP  PFT 04/2016  No obs, nml lung volumes, DLCO 50%  RAST neg ANA, CCP neg   Review of Systems neg for any significant sore throat, dysphagia, itching, sneezing, nasal congestion or excess/ purulent secretions, fever, chills, sweats, unintended wt loss, pleuritic or exertional cp, hempoptysis, orthopnea pnd or change in chronic leg swelling. Also denies presyncope, palpitations, heartburn, abdominal pain, nausea, vomiting, diarrhea or change in bowel or urinary habits,  dysuria,hematuria, rash, arthralgias, visual complaints, headache, numbness weakness or ataxia.     Objective:   Physical Exam   Gen. Pleasant, well-nourished, in no distress ENT - no lesions, no post nasal drip Neck: No JVD, no thyromegaly, no carotid bruits Lungs: no use of accessory muscles, no dullness to percussion, RLL rales, no rhonchi  Cardiovascular: Rhythm regular, heart sounds  normal, no murmurs or gallops, no peripheral edema Musculoskeletal: No deformities, no cyanosis or clubbing         Assessment & Plan:

## 2016-10-22 ENCOUNTER — Encounter: Payer: Self-pay | Admitting: *Deleted

## 2016-10-22 DIAGNOSIS — J3489 Other specified disorders of nose and nasal sinuses: Secondary | ICD-10-CM | POA: Diagnosis not present

## 2016-10-22 DIAGNOSIS — J342 Deviated nasal septum: Secondary | ICD-10-CM | POA: Diagnosis not present

## 2016-10-22 DIAGNOSIS — J343 Hypertrophy of nasal turbinates: Secondary | ICD-10-CM | POA: Diagnosis not present

## 2016-10-26 ENCOUNTER — Telehealth: Payer: Self-pay | Admitting: Cardiovascular Disease

## 2016-10-26 NOTE — Telephone Encounter (Signed)
I spoke with the pt and she just had sinus surgery last Friday and would like to know if she needs to keep the appt that is scheduled for 10/29/16.  The pt denies any cardiac symptoms at this time and I advised her that we can reschedule her appointment.  New appointment scheduled on 12/17/16 with Dr Burt Knack.

## 2016-10-26 NOTE — Telephone Encounter (Signed)
New Message  Pt voiced wanting to know if she needs to continue to keep this appt.  Pt voiced she had surgery on her nose and takes two weeks.  Please f/u

## 2016-10-29 ENCOUNTER — Ambulatory Visit: Payer: Medicare Other | Admitting: Cardiovascular Disease

## 2016-11-02 DIAGNOSIS — R69 Illness, unspecified: Secondary | ICD-10-CM | POA: Diagnosis not present

## 2016-11-24 DIAGNOSIS — M1711 Unilateral primary osteoarthritis, right knee: Secondary | ICD-10-CM | POA: Diagnosis not present

## 2016-12-09 ENCOUNTER — Ambulatory Visit (INDEPENDENT_AMBULATORY_CARE_PROVIDER_SITE_OTHER): Payer: Medicare HMO | Admitting: Cardiovascular Disease

## 2016-12-09 ENCOUNTER — Encounter: Payer: Self-pay | Admitting: Cardiovascular Disease

## 2016-12-09 VITALS — BP 134/86 | HR 56 | Ht 64.75 in | Wt 151.8 lb

## 2016-12-09 DIAGNOSIS — I2510 Atherosclerotic heart disease of native coronary artery without angina pectoris: Secondary | ICD-10-CM

## 2016-12-09 NOTE — Progress Notes (Signed)
Cardiology Office Note Date:  12/10/2016   ID:  Bianca [name], DOB 07-Mar-1946, MRN [id number]  PCP:  Horatio Pel, MD  Cardiologist:  Sherren Mocha, MD    Chief Complaint  Patient presents with  . Coronary Artery Disease     History of Present Illness: Bianca [name] is a 71 y.o. female who presents for follow-up of coronary artery disease, hypertension, and hyperlipidemia. CAD was diagnosed on a gated cardiac CTA several years ago. She's had a few chest pains since her last visit, occurred at rest, and lasted a short duration, thinks it was related to something she ate. No other problems at all. 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 She has mild shortness of breath with activity, has been diagnosed with interstitial lung disease followed by Dr. Elsworth Soho. No orthopnea, PND, or leg swelling. Feels like her symptoms are worse during allergy season.   Past Medical History:  Diagnosis Date  . Arthritis   . Elevated cholesterol   . Hypertension     Past Surgical History:  Procedure Laterality Date  . ABDOMINAL SURGERY  1988   Exp. Laparotomy -Micro tuboplasty  . ANTERIOR CRUCIATE LIGAMENT REPAIR    . COSMETIC SURGERY    . hemorrdectomy    . OOPHORECTOMY  2009   BSO cystadenofibroma  . PELVIC LAPAROSCOPY     DL with tubal lavage    Current Outpatient Prescriptions  Medication Sig Dispense Refill  . aspirin 81 MG tablet Take 81 mg by mouth every other day.    . Cholecalciferol (VITAMIN D PO) Take 1,000 Units by mouth daily.     . Cyanocobalamin (VITAMIN B 12 PO) Take 1,000 mcg by mouth daily.     . metoprolol succinate (TOPROL-XL) 50 MG 24 hr tablet TAKE 1/2 TO 1 TABLET BY MOUTH EVERY MORNING  2  . POTASSIUM BICARBONATE PO Take 550 mg by mouth daily.    Marland Kitchen pyridoxine (B-6) 100 MG tablet Take 100 mg by mouth daily.    . rosuvastatin (CRESTOR) 20 MG tablet Take 20 mg by mouth daily.    Marland Kitchen telmisartan (MICARDIS) 80 MG tablet Take 80 mg by  mouth daily.  2  . vitamin C (ASCORBIC ACID) 500 MG tablet Take 500 mg by mouth daily.    . vitamin E 400 UNIT capsule Take 400 Units by mouth daily.     No current facility-administered medications for this visit.     Allergies:   Patient has no known allergies.   Social History:  The patient  reports that she has quit smoking. She has never used smokeless tobacco. She reports that she drinks alcohol. She reports that she does not use drugs.   Family History:  The patient's family history includes Heart disease in her father and paternal grandfather; Hypertension in her father; Pancreatic cancer in her mother; Stroke in her paternal grandfather.    ROS:  Please see the history of present illness.  Otherwise, review of systems is positive for cough, shortness of breath with activity, joint swelling.  All other systems are reviewed and negative.    PHYSICAL EXAM: VS:  BP 134/86   Pulse (!) 56   Ht 5' 4.75" (1.645 m)   Wt 151 lb 12.8 oz (68.9 kg)   BMI 25.46 kg/m  , BMI Body mass index is 25.46 kg/m. GEN: Well nourished, well developed, in no acute distress  HEENT: normal  Neck: no JVD, no masses. No carotid bruits  Cardiac: RRR without murmur or gallop                Respiratory:  clear to auscultation bilaterally, normal work of breathing GI: soft, nontender, nondistended, + BS MS: no deformity or atrophy  Ext: no pretibial edema, pedal pulses 2+= bilaterally Skin: warm and dry, no rash Neuro:  Strength and sensation are intact Psych: euthymic mood, full affect  EKG:  EKG is ordered today. The ekg ordered today shows sinus bradycardia 57 bpm, otherwise normal  Recent Labs: No results found for requested labs within last 8760 hours.   Lipid Panel     Component Value Date/Time   CHOL  12/06/2010 0840    147        ATP III CLASSIFICATION:  <200     mg/dL   Desirable  200-239  mg/dL   Borderline High  >=240    mg/dL   High          TRIG 86 12/06/2010 0840   HDL 63  12/06/2010 0840   CHOLHDL 2.3 12/06/2010 0840   VLDL 17 12/06/2010 0840   LDLCALC  12/06/2010 0840    67        Total Cholesterol/HDL:CHD Risk Coronary Heart Disease Risk Table                     Men   Women  1/2 Average Risk   3.4   3.3  Average Risk       5.0   4.4  2 X Average Risk   9.6   7.1  3 X Average Risk  23.4   11.0        Use the calculated Patient Ratio above and the CHD Risk Table to determine the patient's CHD Risk.        ATP III CLASSIFICATION (LDL):  <100     mg/dL   Optimal  100-129  mg/dL   Near or Above                    Optimal  130-159  mg/dL   Borderline  160-189  mg/dL   High  >190     mg/dL   Very High      Wt Readings from Last 3 Encounters:  12/09/16 151 lb 12.8 oz (68.9 kg)  09/08/16 149 lb (67.6 kg)  05/06/16 145 lb (65.8 kg)    ASSESSMENT AND PLAN: 1.  Coronary artery disease, native vessel, without angina: Medical program is reviewed and includes aspirin and rosuvastatin. She is completely asymptomatic.  2. Essential hypertension: Blood pressure is well controlled on telmisartan and metoprolol succinate  3. Hyperlipidemia: Treated with Crestor 20 mg, followed by PCP.  Current medicines are reviewed with the patient today.  The patient does not have concerns regarding medicines.  Labs/ tests ordered today include:   Orders Placed This Encounter  Procedures  . EKG 12-Lead    Disposition:   FU one year  Signed, Sherren Mocha, MD  12/10/2016 1:02 PM    [facility] [address] [place], [place], [place]  [address] Phone: [phone]; Fax: [phone]

## 2016-12-09 NOTE — Patient Instructions (Signed)

## 2016-12-17 ENCOUNTER — Ambulatory Visit: Payer: Medicare HMO | Admitting: Cardiovascular Disease

## 2017-02-09 DIAGNOSIS — Z1231 Encounter for screening mammogram for malignant neoplasm of breast: Secondary | ICD-10-CM | POA: Diagnosis not present

## 2017-02-10 DIAGNOSIS — R1314 Dysphagia, pharyngoesophageal phase: Secondary | ICD-10-CM | POA: Diagnosis not present

## 2017-02-10 DIAGNOSIS — K219 Gastro-esophageal reflux disease without esophagitis: Secondary | ICD-10-CM | POA: Diagnosis not present

## 2017-02-10 DIAGNOSIS — M26623 Arthralgia of bilateral temporomandibular joint: Secondary | ICD-10-CM | POA: Diagnosis not present

## 2017-03-03 DIAGNOSIS — I10 Essential (primary) hypertension: Secondary | ICD-10-CM | POA: Diagnosis not present

## 2017-03-03 DIAGNOSIS — Z87891 Personal history of nicotine dependence: Secondary | ICD-10-CM | POA: Diagnosis not present

## 2017-03-03 DIAGNOSIS — Z6823 Body mass index (BMI) 23.0-23.9, adult: Secondary | ICD-10-CM | POA: Diagnosis not present

## 2017-03-03 DIAGNOSIS — E785 Hyperlipidemia, unspecified: Secondary | ICD-10-CM | POA: Diagnosis not present

## 2017-03-03 DIAGNOSIS — Z79899 Other long term (current) drug therapy: Secondary | ICD-10-CM | POA: Diagnosis not present

## 2017-03-03 DIAGNOSIS — Z7289 Other problems related to lifestyle: Secondary | ICD-10-CM | POA: Diagnosis not present

## 2017-03-03 DIAGNOSIS — Z0000 Encounter for general adult medical examination without abnormal findings: Secondary | ICD-10-CM | POA: Diagnosis not present

## 2017-03-04 DIAGNOSIS — M546 Pain in thoracic spine: Secondary | ICD-10-CM | POA: Diagnosis not present

## 2017-03-04 DIAGNOSIS — M9902 Segmental and somatic dysfunction of thoracic region: Secondary | ICD-10-CM | POA: Diagnosis not present

## 2017-03-04 DIAGNOSIS — M542 Cervicalgia: Secondary | ICD-10-CM | POA: Diagnosis not present

## 2017-03-04 DIAGNOSIS — M9901 Segmental and somatic dysfunction of cervical region: Secondary | ICD-10-CM | POA: Diagnosis not present

## 2017-03-08 ENCOUNTER — Ambulatory Visit (INDEPENDENT_AMBULATORY_CARE_PROVIDER_SITE_OTHER)
Admission: RE | Admit: 2017-03-08 | Discharge: 2017-03-08 | Disposition: A | Discharge status: Home / Self Care | Payer: Medicare HMO | Source: Ambulatory Visit | Attending: Pulmonary Disease | Admitting: Pulmonary Disease

## 2017-03-08 ENCOUNTER — Encounter (INDEPENDENT_AMBULATORY_CARE_PROVIDER_SITE_OTHER): Payer: Self-pay

## 2017-03-08 DIAGNOSIS — M542 Cervicalgia: Secondary | ICD-10-CM | POA: Diagnosis not present

## 2017-03-08 DIAGNOSIS — M9901 Segmental and somatic dysfunction of cervical region: Secondary | ICD-10-CM | POA: Diagnosis not present

## 2017-03-08 DIAGNOSIS — M546 Pain in thoracic spine: Secondary | ICD-10-CM | POA: Diagnosis not present

## 2017-03-08 DIAGNOSIS — M9902 Segmental and somatic dysfunction of thoracic region: Secondary | ICD-10-CM | POA: Diagnosis not present

## 2017-03-08 DIAGNOSIS — J849 Interstitial pulmonary disease, unspecified: Secondary | ICD-10-CM

## 2017-03-08 DIAGNOSIS — J439 Emphysema, unspecified: Secondary | ICD-10-CM | POA: Diagnosis not present

## 2017-03-15 NOTE — Progress Notes (Signed)
Pt returned call. Informed her of the recs per RA. Appt made with SG on 03/16/2017 to review results. Pt verbalized understanding and denied any further questions or concerns at this time.

## 2017-03-16 ENCOUNTER — Ambulatory Visit (INDEPENDENT_AMBULATORY_CARE_PROVIDER_SITE_OTHER): Payer: Medicare HMO | Admitting: Acute Care

## 2017-03-16 ENCOUNTER — Encounter: Payer: Self-pay | Admitting: Acute Care

## 2017-03-16 VITALS — BP 110/68 | HR 68 | Ht 64.75 in | Wt 146.0 lb

## 2017-03-16 DIAGNOSIS — M9901 Segmental and somatic dysfunction of cervical region: Secondary | ICD-10-CM | POA: Diagnosis not present

## 2017-03-16 DIAGNOSIS — J849 Interstitial pulmonary disease, unspecified: Secondary | ICD-10-CM

## 2017-03-16 DIAGNOSIS — M542 Cervicalgia: Secondary | ICD-10-CM | POA: Diagnosis not present

## 2017-03-16 DIAGNOSIS — M546 Pain in thoracic spine: Secondary | ICD-10-CM | POA: Diagnosis not present

## 2017-03-16 DIAGNOSIS — M9902 Segmental and somatic dysfunction of thoracic region: Secondary | ICD-10-CM | POA: Diagnosis not present

## 2017-03-16 NOTE — Progress Notes (Signed)
History of Present Illness Bianca [name] is a 71 y.o. female former smoker with abnormal imaging per CT. She is followed by Dr. Elsworth Soho.   03/16/2017 Follow Up OV: Pt. Presents for follow up to discuss most recent HRCT results. She is being followed by Dr. Elsworth Soho for questionable NSIP versus early UIP. She presents today for six-month follow-up CT scan. Patient states she has been doing well. She states she has no respiratory symptoms or shortness of breath. Pt. States she used to have a significant recurrent bronchitis, and then she had nasal surgery per Dr. Benjamine Mola recently and has had no further bronchitis. She does have occasional rhinitis, but she has no other respiratory symptoms.She denies shortness of breath on exertion. She dances twice weekly, and is very active. 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 She denies fever, chest pain, orthopnea, or hemoptysis. Test Results:  HRCT 03/2016 >>patchy areas of ground-glass attenuation which are peripheral and peribronchovascular predominant, most evident throughout the lung bases bilaterally. ? NSIP vs early UIP  PFT 04/2016  No obs, nml lung volumes, DLCO 50%/ moderately severe diffusion deficit consider pulmonary vascular disease.  HRCT 03/08/2017>> scattered areas of mild fibrosis in the lung bases bilaterally which are compatible with underlying interstitial lung disease. At at this time, the pattern is considered nonspecific. This could simply reflect mild nonspecific interstitial pneumonia (NSIP), the possibility of very early usual interstitial pneumonia (UIP) is not excluded ("CT pattern indeterminate for UIP").  Autoimmune lab work RAST neg ANA, CCP neg  CBC Latest Ref Rng & Units 12/06/2010 12/05/2010  WBC 4.0 - 10.5 K/uL 3.9(L) 8.8  Hemoglobin 12.0 - 15.0 g/dL 11.1(L) 12.5  Hematocrit 36.0 - 46.0 % 33.1(L) 36.8  Platelets 150 - 400 K/uL 171 184    BMP Latest Ref Rng & Units 12/06/2010 12/05/2010    Glucose 70 - 99 mg/dL 101(H) 113(H)  BUN 6 - 23 mg/dL 14 19  Creatinine 0.4 - 1.2 mg/dL 0.75 0.83  Sodium 135 - 145 mEq/L 139 135  Potassium 3.5 - 5.1 mEq/L 4.1 DELTA CHECK NOTED 3.4(L)  Chloride 96 - 112 mEq/L 109 102  CO2 19 - 32 mEq/L 24 24  Calcium 8.4 - 10.5 mg/dL 8.9 9.3     PFT    Component Value Date/Time   FEV1PRE 2.34 05/10/2016 1651   FEV1POST 2.52 05/10/2016 1651   FVCPRE 3.06 05/10/2016 1651   FVCPOST 3.22 05/10/2016 1651   TLC 5.06 05/10/2016 1651   DLCOUNC 11.68 05/10/2016 1651   PREFEV1FVCRT 77 05/10/2016 1651   PSTFEV1FVCRT 78 05/10/2016 1651    Ct Chest High Resolution  Result Date: 03/09/2017 CLINICAL DATA:  71 year old female with history of interstitial lung disease. Followup study. EXAM: CT CHEST WITHOUT CONTRAST TECHNIQUE: Multidetector CT imaging of the chest was performed following the standard protocol without intravenous contrast. High resolution imaging of the lungs, as well as inspiratory and expiratory imaging, was performed. COMPARISON:  No priors. FINDINGS: Cardiovascular: Heart size is normal. There is no significant pericardial fluid, thickening or pericardial calcification. There is aortic atherosclerosis, as well as atherosclerosis of the great vessels of the mediastinum and the coronary arteries, including calcified atherosclerotic plaque in the left anterior descending, left circumflex and right coronary arteries. Mediastinum/Nodes: No pathologically enlarged mediastinal or hilar lymph nodes. Please note that accurate exclusion of hilar adenopathy is limited on noncontrast CT scans. Esophagus is unremarkable in appearance. No axillary lymphadenopathy. Lungs/Pleura: High-resolution images demonstrates some patchy areas of  ground-glass attenuation, septal thickening and mild architectural distortion predominantly in the lung bases. No traction bronchiectasis or definite honeycombing. Inspiratory and expiratory imaging demonstrates minimal air trapping,  indicative of mild small airways disease. No acute consolidative airspace disease. No pleural effusions. A few scattered 1-3 mm pulmonary nodules are noted throughout the lungs bilaterally, highly nonspecific, but statistically likely benign. No other larger more suspicious appearing pulmonary nodules or masses are noted. Mild paraseptal emphysema in notable only in the lung apices. Upper Abdomen: Aortic atherosclerosis. Musculoskeletal: There are no aggressive appearing lytic or blastic lesions noted in the visualized portions of the skeleton. IMPRESSION: 1. There are scattered areas of mild fibrosis in the lung bases bilaterally which are compatible with underlying interstitial lung disease. At at this time, the pattern is considered nonspecific. This could simply reflect mild nonspecific interstitial pneumonia (NSIP), however, repeat high-resolution chest CT is recommended in 12 months to evaluate for temporal changes in the appearance of the lung parenchyma, as the possibility of very early usual interstitial pneumonia (UIP) is not excluded ("CT pattern indeterminate for UIP"). 2. Mild paraseptal emphysema. 3. Aortic atherosclerosis, in addition to three-vessel coronary artery disease. Assessment for potential risk factor modification, dietary therapy or pharmacologic therapy may be warranted, if clinically indicated. Aortic Atherosclerosis (ICD10-I70.0) and Emphysema (ICD10-J43.9). Electronically Signed   By: Vinnie Langton M.D.   On: 03/09/2017 08:43     Past medical hx Past Medical History:  Diagnosis Date  . Arthritis   . Elevated cholesterol   . Hypertension      Social History  Substance Use Topics  . Smoking status: Former Research scientist (life sciences)  . Smokeless tobacco: Never Used  . Alcohol use 0.0 oz/week     Comment: Rare    Ms.Seelye reports that she has quit smoking. She has never used smokeless tobacco. She reports that she drinks alcohol. She reports that she does not use drugs.  Tobacco  Cessation: Patient is a former smoker, no pack year history or quit date noted in Epic  Past surgical hx, Family hx, Social hx all reviewed.  Current Outpatient Prescriptions on File Prior to Visit  Medication Sig  . aspirin 81 MG tablet Take 81 mg by mouth every other day.  . Cholecalciferol (VITAMIN D PO) Take 1,000 Units by mouth daily.   . Cyanocobalamin (VITAMIN B 12 PO) Take 1,000 mcg by mouth daily.   . metoprolol succinate (TOPROL-XL) 50 MG 24 hr tablet TAKE 1/2 TO 1 TABLET BY MOUTH EVERY MORNING  . POTASSIUM BICARBONATE PO Take 550 mg by mouth daily.  Marland Kitchen pyridoxine (B-6) 100 MG tablet Take 100 mg by mouth daily.  . rosuvastatin (CRESTOR) 20 MG tablet Take 20 mg by mouth daily.  Marland Kitchen telmisartan (MICARDIS) 80 MG tablet Take 80 mg by mouth daily.  . vitamin C (ASCORBIC ACID) 500 MG tablet Take 500 mg by mouth daily.  . vitamin E 400 UNIT capsule Take 400 Units by mouth daily.   No current facility-administered medications on file prior to visit.      No Known Allergies  Review Of Systems:  Constitutional:   No  weight loss, night sweats,  Fevers, chills, fatigue, or  lassitude.  HEENT:   No headaches,  Difficulty swallowing,  Tooth/dental problems, or  Sore throat,                No sneezing, itching, ear ache, nasal congestion, post nasal drip,   CV:  No chest pain,  Orthopnea, PND, swelling in lower extremities,  anasarca, dizziness, palpitations, syncope.   GI  No heartburn, indigestion, abdominal pain, nausea, vomiting, diarrhea, change in bowel habits, loss of appetite, bloody stools.   Resp: No shortness of breath with exertion or at rest.  No excess mucus, no productive cough,  No non-productive cough,  No coughing up of blood.  No change in color of mucus.  No wheezing.  No chest wall deformity  Skin: no rash or lesions.  GU: no dysuria, change in color of urine, no urgency or frequency.  No flank pain, no hematuria   MS:  No joint pain or swelling.  No decreased  range of motion.  No back pain.  Psych:  No change in mood or affect. No depression or anxiety.  No memory loss.   Vital Signs BP 110/68 (BP Location: Left Arm, Patient Position: Sitting, Cuff Size: Normal)   Pulse 68   Ht 5' 4.75" (1.645 m)   Wt 146 lb (66.2 kg)   SpO2 98%   BMI 24.48 kg/m    Physical Exam:  General- No distress,  A&Ox3, pleasant ENT: No sinus tenderness, TM clear, pale nasal mucosa, no oral exudate,no post nasal drip, no LAN Cardiac: S1, S2, regular rate and rhythm, no murmur Chest: No wheeze/ rales/ dullness; no accessory muscle use, no nasal flaring, no sternal retractions, few crackles per bases Abd.: Soft Non-tender, nondistended, bowel sounds positive Ext: No clubbing cyanosis, edema Neuro:  normal strength Skin: No rashes, warm and dry Psych: normal mood and behavior   Assessment/Plan  ILD (interstitial lung disease) (HCC) HRCT indicates scattered areas of mild fibrosis in the lung bases bilaterally compatible with underlying ILD, nonspecific pattern. This could simply reflect mild nonspecific NSIP Patient is totally asymptomatic, denies shortness of breath, cough, dyspnea. Plan Your CT does show scattered areas of mild fibrosis  which are compatible with underlying interstitial lung disease. At at this time, the pattern is considered nonspecific. This could simply reflect mild nonspecific interstitial pneumonia (NSIP) We will schedule you for PFT's to evaluate your Pulmonary Function We will call you with results. We will schedule a follow up HRCT in 12 months to follow for progression. Follow up with Dr. Elsworth Soho in 1  year with CT prior. Please make sure he contact the office and schedule a visit if you develop any symptoms or worsening shortness of breath, cough Please contact office for sooner follow up if symptoms do not improve or worsen or seek emergency care      Magdalen Spatz, NP 03/16/2017  10:04 PM

## 2017-03-16 NOTE — Patient Instructions (Addendum)
It is nice to meet you today. Your CT does show scattered areas of mild fibrosis  which are compatible with underlying interstitial lung disease. At at this time, the pattern is considered nonspecific. This could simply reflect mild nonspecific interstitial pneumonia (NSIP) We will schedule you for PFT's to evaluate your Pulmonary Function We will call you with results. We will schedule a follow up HRCT in 12 months to follow for progression. Follow up with Dr. Elsworth Soho in 1  year with CT prior. Please make sure he contact the office and schedule a visit if you develop any symptoms or worsening shortness of breath, cough Please contact office for sooner follow up if symptoms do not improve or worsen or seek emergency care

## 2017-03-16 NOTE — Assessment & Plan Note (Signed)
HRCT indicates scattered areas of mild fibrosis in the lung bases bilaterally compatible with underlying ILD, nonspecific pattern. This could simply reflect mild nonspecific NSIP Patient is totally asymptomatic, denies shortness of breath, cough, dyspnea. Plan Your CT does show scattered areas of mild fibrosis  which are compatible with underlying interstitial lung disease. At at this time, the pattern is considered nonspecific. This could simply reflect mild nonspecific interstitial pneumonia (NSIP) We will schedule you for PFT's to evaluate your Pulmonary Function We will call you with results. We will schedule a follow up HRCT in 12 months to follow for progression. Follow up with Dr. Elsworth Soho in 1  year with CT prior. Please make sure he contact the office and schedule a visit if you develop any symptoms or worsening shortness of breath, cough Please contact office for sooner follow up if symptoms do not improve or worsen or seek emergency care

## 2017-03-18 ENCOUNTER — Telehealth: Payer: Self-pay

## 2017-03-18 DIAGNOSIS — M546 Pain in thoracic spine: Secondary | ICD-10-CM | POA: Diagnosis not present

## 2017-03-18 DIAGNOSIS — M9901 Segmental and somatic dysfunction of cervical region: Secondary | ICD-10-CM | POA: Diagnosis not present

## 2017-03-18 DIAGNOSIS — M542 Cervicalgia: Secondary | ICD-10-CM | POA: Diagnosis not present

## 2017-03-18 DIAGNOSIS — M9902 Segmental and somatic dysfunction of thoracic region: Secondary | ICD-10-CM | POA: Diagnosis not present

## 2017-03-18 NOTE — Telephone Encounter (Signed)
-----   Message from Magdalen Spatz, NP sent at 03/18/2017  1:46 PM EDT ----- Regarding: RE: HRCT Basic serology had been done last year and was negative.  Margie, Please let patient know Dr. Elsworth Soho wants to see her in 6 months. Please schedule her for an appointment with him in 6 months.Thanks so much ----- Message ----- From: Rigoberto Noel, MD Sent: 03/17/2017  12:07 PM To: Magdalen Spatz, NP Subject: RE: HRCT                                       Make it 6 months follow-up Check basic serology- if not done before -ANA, CCP  Thanks,  RA ----- Message ----- From: Magdalen Spatz, NP Sent: 03/16/2017   9:58 PM To: Rigoberto Noel, MD Subject: HRCT                                           Dr. Elsworth Soho, I saw Ms. Ricci today regarding her HRCT. HRCT indicates mild fibrosis in the bases bilaterally compatible with underlying ILD. This is an upgrade from last years impression which stated could be indicative of ILD. Patient is totally asymptomatic. She has not had PFTs in the year therefore I have ordered PFTs. To evaluate DLCO. Because she is totally asymptomatic I had thought one-year follow-up with you is  Appropriate, with HRCT at that time. Please advise if you want to handle this any differently. This is the patient I had texted  you about earlier to discuss. Please let me know if thoughts. Thank you Judson Roch

## 2017-03-18 NOTE — Telephone Encounter (Signed)
Lm to make pt aware of SG & RA recommendations. 14mo recall has been placed.

## 2017-03-22 DIAGNOSIS — M542 Cervicalgia: Secondary | ICD-10-CM | POA: Diagnosis not present

## 2017-03-22 DIAGNOSIS — M9902 Segmental and somatic dysfunction of thoracic region: Secondary | ICD-10-CM | POA: Diagnosis not present

## 2017-03-22 DIAGNOSIS — M9901 Segmental and somatic dysfunction of cervical region: Secondary | ICD-10-CM | POA: Diagnosis not present

## 2017-03-22 DIAGNOSIS — M546 Pain in thoracic spine: Secondary | ICD-10-CM | POA: Diagnosis not present

## 2017-03-23 NOTE — Telephone Encounter (Signed)
Pt returning call for results and can be reached @ [phone].Bianca [name]

## 2017-03-23 NOTE — Telephone Encounter (Signed)
Pt does not wish to have PFT at this time. She wants to discuss this with RA at her next OV.

## 2017-03-23 NOTE — Telephone Encounter (Signed)
Please schedule PFTs prior to next appointment

## 2017-03-23 NOTE — Telephone Encounter (Signed)
I have spoken with pt. Pt is aware that 76mo has been placed.  Pt states she is scheduled for PFT on 04/04/17. Pt states she is unsure if she needs this test, as she is not having any pulmonary symptoms.  Pt would like to discuss this further at her next OV. PFT has been canceled per pt request.  Will route to RA and SG to make aware. Thanks.

## 2017-03-25 DIAGNOSIS — M546 Pain in thoracic spine: Secondary | ICD-10-CM | POA: Diagnosis not present

## 2017-03-25 DIAGNOSIS — M9901 Segmental and somatic dysfunction of cervical region: Secondary | ICD-10-CM | POA: Diagnosis not present

## 2017-03-25 DIAGNOSIS — M542 Cervicalgia: Secondary | ICD-10-CM | POA: Diagnosis not present

## 2017-03-25 DIAGNOSIS — M9902 Segmental and somatic dysfunction of thoracic region: Secondary | ICD-10-CM | POA: Diagnosis not present

## 2017-04-01 DIAGNOSIS — M9902 Segmental and somatic dysfunction of thoracic region: Secondary | ICD-10-CM | POA: Diagnosis not present

## 2017-04-01 DIAGNOSIS — M9901 Segmental and somatic dysfunction of cervical region: Secondary | ICD-10-CM | POA: Diagnosis not present

## 2017-04-01 DIAGNOSIS — M546 Pain in thoracic spine: Secondary | ICD-10-CM | POA: Diagnosis not present

## 2017-04-01 DIAGNOSIS — M542 Cervicalgia: Secondary | ICD-10-CM | POA: Diagnosis not present

## 2017-04-05 DIAGNOSIS — R69 Illness, unspecified: Secondary | ICD-10-CM | POA: Diagnosis not present

## 2017-04-08 DIAGNOSIS — M19042 Primary osteoarthritis, left hand: Secondary | ICD-10-CM | POA: Diagnosis not present

## 2017-04-08 DIAGNOSIS — R69 Illness, unspecified: Secondary | ICD-10-CM | POA: Diagnosis not present

## 2017-04-08 DIAGNOSIS — M19041 Primary osteoarthritis, right hand: Secondary | ICD-10-CM | POA: Diagnosis not present

## 2017-04-29 DIAGNOSIS — M9902 Segmental and somatic dysfunction of thoracic region: Secondary | ICD-10-CM | POA: Diagnosis not present

## 2017-04-29 DIAGNOSIS — M9901 Segmental and somatic dysfunction of cervical region: Secondary | ICD-10-CM | POA: Diagnosis not present

## 2017-04-29 DIAGNOSIS — M542 Cervicalgia: Secondary | ICD-10-CM | POA: Diagnosis not present

## 2017-04-29 DIAGNOSIS — M546 Pain in thoracic spine: Secondary | ICD-10-CM | POA: Diagnosis not present

## 2017-05-09 DIAGNOSIS — R69 Illness, unspecified: Secondary | ICD-10-CM | POA: Diagnosis not present

## 2017-05-12 DIAGNOSIS — M19041 Primary osteoarthritis, right hand: Secondary | ICD-10-CM | POA: Diagnosis not present

## 2017-05-12 DIAGNOSIS — I6523 Occlusion and stenosis of bilateral carotid arteries: Secondary | ICD-10-CM | POA: Diagnosis not present

## 2017-05-12 DIAGNOSIS — J849 Interstitial pulmonary disease, unspecified: Secondary | ICD-10-CM | POA: Diagnosis not present

## 2017-06-02 DIAGNOSIS — I6523 Occlusion and stenosis of bilateral carotid arteries: Secondary | ICD-10-CM | POA: Diagnosis not present

## 2017-08-29 DIAGNOSIS — I10 Essential (primary) hypertension: Secondary | ICD-10-CM | POA: Diagnosis not present

## 2017-08-29 DIAGNOSIS — E559 Vitamin D deficiency, unspecified: Secondary | ICD-10-CM | POA: Diagnosis not present

## 2017-08-29 DIAGNOSIS — E7800 Pure hypercholesterolemia, unspecified: Secondary | ICD-10-CM | POA: Diagnosis not present

## 2017-09-06 DIAGNOSIS — Z780 Asymptomatic menopausal state: Secondary | ICD-10-CM | POA: Diagnosis not present

## 2017-09-06 DIAGNOSIS — J302 Other seasonal allergic rhinitis: Secondary | ICD-10-CM | POA: Diagnosis not present

## 2017-09-06 DIAGNOSIS — K589 Irritable bowel syndrome without diarrhea: Secondary | ICD-10-CM | POA: Diagnosis not present

## 2017-09-06 DIAGNOSIS — K219 Gastro-esophageal reflux disease without esophagitis: Secondary | ICD-10-CM | POA: Diagnosis not present

## 2017-09-06 DIAGNOSIS — E7800 Pure hypercholesterolemia, unspecified: Secondary | ICD-10-CM | POA: Diagnosis not present

## 2017-09-06 DIAGNOSIS — I10 Essential (primary) hypertension: Secondary | ICD-10-CM | POA: Diagnosis not present

## 2017-09-06 DIAGNOSIS — D126 Benign neoplasm of colon, unspecified: Secondary | ICD-10-CM | POA: Diagnosis not present

## 2017-09-06 DIAGNOSIS — E042 Nontoxic multinodular goiter: Secondary | ICD-10-CM | POA: Diagnosis not present

## 2017-09-06 DIAGNOSIS — Z0000 Encounter for general adult medical examination without abnormal findings: Secondary | ICD-10-CM | POA: Diagnosis not present

## 2017-09-06 DIAGNOSIS — J438 Other emphysema: Secondary | ICD-10-CM | POA: Diagnosis not present

## 2017-09-09 ENCOUNTER — Ambulatory Visit: Payer: Medicare HMO | Admitting: Gynecology

## 2017-09-09 ENCOUNTER — Encounter: Payer: Self-pay | Admitting: Gynecology

## 2017-09-09 VITALS — BP 118/78 | Ht 64.0 in | Wt 146.0 lb

## 2017-09-09 DIAGNOSIS — Z01411 Encounter for gynecological examination (general) (routine) with abnormal findings: Secondary | ICD-10-CM

## 2017-09-09 DIAGNOSIS — Z124 Encounter for screening for malignant neoplasm of cervix: Secondary | ICD-10-CM | POA: Diagnosis not present

## 2017-09-09 DIAGNOSIS — N952 Postmenopausal atrophic vaginitis: Secondary | ICD-10-CM

## 2017-09-09 DIAGNOSIS — L723 Sebaceous cyst: Secondary | ICD-10-CM

## 2017-09-09 NOTE — Patient Instructions (Signed)
Follow-up in 1 year for annual exam, sooner as needed. 

## 2017-09-09 NOTE — Progress Notes (Signed)
    Bianca [name] 1945-11-30 [id number]        72 y.o.  G0P0 for breast and pelvic exam.  Past medical history,surgical history, problem list, medications, allergies, family history and social history were all reviewed and documented as reviewed in the EPIC chart.  ROS:  Performed with pertinent positives and negatives included in the history, assessment and plan.   Additional significant findings : None   Exam: [name] assistant Vitals:   09/09/17 1523  BP: 118/78  Weight: 146 lb (66.2 kg)  Height: 5\' 4"  (1.626 m)   Body mass index is 25.06 kg/m.  General appearance:  Normal affect, orientation and appearance. Skin: Grossly normal HEENT: Without gross lesions.  No cervical or supraclavicular adenopathy. Thyroid normal.  Lungs:  Clear without wheezing, rales or rhonchi Cardiac: RR, without RMG Abdominal:  Soft, nontender, without masses, guarding, rebound, organomegaly or hernia Breasts:  Examined lying and sitting without masses, retractions, discharge or axillary adenopathy. Pelvic:  Ext, BUS, Vagina: With atrophic changes.  Small classic sebaceous cyst left mid labia majora.  Cervix: With atrophic changes.  Pap smear done  Uterus: Anteverted, normal size, shape and contour, midline and mobile nontender   Adnexa: Without masses or tenderness    Anus and perineum: Normal   Rectovaginal: Normal sphincter tone without palpated masses or tenderness.    Assessment/Plan:  72 y.o. G0P0 female for breast and pelvic exam.  Status post laparoscopic BSO 2009 for cystadenofibroma.  1. Postmenopausal/atrophic genital changes.  No significant hot flushes, night sweats, vaginal dryness or any vaginal bleeding.  Continue to monitor and report any issues or bleeding. 2. Small sebaceous cyst left vulva classic in appearance.  Not bothersome to the patient.  Patient will continue to follow and as long as it remains stable then she will follow.  If any changes she will follow-up for  evaluation. 3. Pap smear 2015.  Patient requests Pap smear this year.  Pap smear done.  No history of abnormal Pap smears.  Options to stop screening per current screening guidelines based on age reviewed. 4. Mammography 2017.  Patient in the process of scheduling and will follow-up for this.  Breast exam normal today. 5. Colonoscopy 2012.  Repeat at their recommended interval. 6. DEXA 2017 through Dr. Pennie Banter office.  I do not have a copy of this and she will continue to follow-up with him in reference to bone health. 7. Health maintenance.  No routine lab work done as patient does this elsewhere.  Follow-up 1 year, sooner as needed.   [name] MD, 4:18 PM 09/09/2017

## 2017-09-13 LAB — PAP IG W/ RFLX HPV ASCU

## 2017-09-22 DIAGNOSIS — I87393 Chronic venous hypertension (idiopathic) with other complications of bilateral lower extremity: Secondary | ICD-10-CM | POA: Diagnosis not present

## 2017-09-28 DIAGNOSIS — I8311 Varicose veins of right lower extremity with inflammation: Secondary | ICD-10-CM | POA: Diagnosis not present

## 2017-10-17 DIAGNOSIS — I8312 Varicose veins of left lower extremity with inflammation: Secondary | ICD-10-CM | POA: Diagnosis not present

## 2017-10-17 DIAGNOSIS — I8311 Varicose veins of right lower extremity with inflammation: Secondary | ICD-10-CM | POA: Diagnosis not present

## 2017-10-17 DIAGNOSIS — I83811 Varicose veins of right lower extremities with pain: Secondary | ICD-10-CM | POA: Diagnosis not present

## 2017-10-17 DIAGNOSIS — I87393 Chronic venous hypertension (idiopathic) with other complications of bilateral lower extremity: Secondary | ICD-10-CM | POA: Diagnosis not present

## 2017-10-21 ENCOUNTER — Encounter: Payer: Self-pay | Admitting: Pulmonary Disease

## 2017-10-21 ENCOUNTER — Ambulatory Visit: Payer: Medicare HMO | Admitting: Pulmonary Disease

## 2017-10-21 DIAGNOSIS — J328 Other chronic sinusitis: Secondary | ICD-10-CM | POA: Diagnosis not present

## 2017-10-21 DIAGNOSIS — J849 Interstitial pulmonary disease, unspecified: Secondary | ICD-10-CM

## 2017-10-21 DIAGNOSIS — J329 Chronic sinusitis, unspecified: Secondary | ICD-10-CM | POA: Insufficient documentation

## 2017-10-21 NOTE — Patient Instructions (Signed)
Schedule PFTs  Schedule high-resolution CT chest, no contrast in 6 months prior to next visit

## 2017-10-21 NOTE — Progress Notes (Signed)
   Subjective:    Patient ID: Bianca [name], female    DOB: [birth year], 72 y.o.   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 She smoked a pack per day since her teenage years until age of 40-about 75 pack years.-she has 2 cats at home.  She presented with abnormal chest x-ray showinginterstitial prominencein 02/2016 and non-resolving bronchitis. -old chest x-ray from 2012  appears clear with mild hyperinflation. She saw Dr. Benjamine Mola and had sinus surgery in 2018, still has significant drainage. Occasionally has early morning cough that seems to resolve as the day goes by.  We reviewed her CAT scans.  No increase in dyspnea she remains very active, no wheezing    Significant tests/ events  HRCT 03/2016 >>patchy areas of ground-glass attenuation which are peripheral and peribronchovascular predominant, most evident throughout the lung bases bilaterally. ? NSIP vs early UIP  HRCT 03/08/2017>> scattered areas of mild fibrosis in the lung bases bilaterally, the pattern is considered nonspecific. " indeterminate for UIP".  PFT 04/2016  No obs, nml lung volumes, DLCO 50%  RAST neg ANA, CCP neg   Review of Systems neg for any significant sore throat, dysphagia, itching, sneezing, nasal congestion or excess/ purulent secretions, fever, chills, sweats, unintended wt loss, pleuritic or exertional cp, hempoptysis, orthopnea pnd or change in chronic leg swelling. Also denies presyncope, palpitations, heartburn, abdominal pain, nausea, vomiting, diarrhea or change in bowel or urinary habits, dysuria,hematuria, rash, arthralgias, visual complaints, headache, numbness weakness or ataxia.     Objective:   Physical Exam   Gen. Pleasant, well-nourished, in no distress ENT - no thrush, no post nasal drip Neck: No JVD, no thyromegaly, no carotid bruits Lungs: no  use of accessory muscles, no dullness to percussion, fine bibasal rales no rhonchi  Cardiovascular: Rhythm regular, heart sounds  normal, no murmurs or gallops, no peripheral edema Musculoskeletal: No deformities, no cyanosis or clubbing         Assessment & Plan:

## 2017-10-21 NOTE — Assessment & Plan Note (Signed)
Very mild ILD, appears stable from 2017, favor NSIP but no clear cause apparent  Schedule PFTs  Schedule high-resolution CT chest, no contrast in 6 months prior to next visit

## 2017-10-21 NOTE — Assessment & Plan Note (Signed)
Status post sinus surgery 2018. Probably cause of the chronic cough

## 2017-11-21 DIAGNOSIS — R69 Illness, unspecified: Secondary | ICD-10-CM | POA: Diagnosis not present

## 2018-01-12 DIAGNOSIS — R32 Unspecified urinary incontinence: Secondary | ICD-10-CM | POA: Diagnosis not present

## 2018-01-12 DIAGNOSIS — I10 Essential (primary) hypertension: Secondary | ICD-10-CM | POA: Diagnosis not present

## 2018-01-12 DIAGNOSIS — Z800 Family history of malignant neoplasm of digestive organs: Secondary | ICD-10-CM | POA: Diagnosis not present

## 2018-01-12 DIAGNOSIS — M1990 Unspecified osteoarthritis, unspecified site: Secondary | ICD-10-CM | POA: Diagnosis not present

## 2018-01-12 DIAGNOSIS — R69 Illness, unspecified: Secondary | ICD-10-CM | POA: Diagnosis not present

## 2018-01-12 DIAGNOSIS — E785 Hyperlipidemia, unspecified: Secondary | ICD-10-CM | POA: Diagnosis not present

## 2018-01-12 DIAGNOSIS — Z791 Long term (current) use of non-steroidal anti-inflammatories (NSAID): Secondary | ICD-10-CM | POA: Diagnosis not present

## 2018-01-12 DIAGNOSIS — R252 Cramp and spasm: Secondary | ICD-10-CM | POA: Diagnosis not present

## 2018-01-12 DIAGNOSIS — J302 Other seasonal allergic rhinitis: Secondary | ICD-10-CM | POA: Diagnosis not present

## 2018-01-12 DIAGNOSIS — G8929 Other chronic pain: Secondary | ICD-10-CM | POA: Diagnosis not present

## 2018-01-23 DIAGNOSIS — R399 Unspecified symptoms and signs involving the genitourinary system: Secondary | ICD-10-CM | POA: Diagnosis not present

## 2018-01-23 DIAGNOSIS — N3000 Acute cystitis without hematuria: Secondary | ICD-10-CM | POA: Diagnosis not present

## 2018-02-21 DIAGNOSIS — Z1231 Encounter for screening mammogram for malignant neoplasm of breast: Secondary | ICD-10-CM | POA: Diagnosis not present

## 2018-02-28 DIAGNOSIS — R300 Dysuria: Secondary | ICD-10-CM | POA: Diagnosis not present

## 2018-02-28 DIAGNOSIS — N3281 Overactive bladder: Secondary | ICD-10-CM | POA: Diagnosis not present

## 2018-03-21 DIAGNOSIS — R69 Illness, unspecified: Secondary | ICD-10-CM | POA: Diagnosis not present

## 2018-03-27 DIAGNOSIS — R6884 Jaw pain: Secondary | ICD-10-CM | POA: Diagnosis not present

## 2018-04-18 DIAGNOSIS — N390 Urinary tract infection, site not specified: Secondary | ICD-10-CM | POA: Diagnosis not present

## 2018-04-18 DIAGNOSIS — N952 Postmenopausal atrophic vaginitis: Secondary | ICD-10-CM | POA: Diagnosis not present

## 2018-04-18 DIAGNOSIS — N3941 Urge incontinence: Secondary | ICD-10-CM | POA: Diagnosis not present

## 2018-04-20 DIAGNOSIS — R69 Illness, unspecified: Secondary | ICD-10-CM | POA: Diagnosis not present

## 2018-04-25 ENCOUNTER — Ambulatory Visit (INDEPENDENT_AMBULATORY_CARE_PROVIDER_SITE_OTHER)
Admission: RE | Admit: 2018-04-25 | Discharge: 2018-04-25 | Disposition: A | Discharge status: Home / Self Care | Payer: Medicare HMO | Source: Ambulatory Visit | Attending: Pulmonary Disease | Admitting: Pulmonary Disease

## 2018-04-25 DIAGNOSIS — J849 Interstitial pulmonary disease, unspecified: Secondary | ICD-10-CM | POA: Diagnosis not present

## 2018-04-25 DIAGNOSIS — J8410 Pulmonary fibrosis, unspecified: Secondary | ICD-10-CM | POA: Diagnosis not present

## 2018-05-03 ENCOUNTER — Ambulatory Visit: Payer: Medicare HMO | Admitting: Pulmonary Disease

## 2018-05-04 ENCOUNTER — Telehealth: Payer: Self-pay | Admitting: Pulmonary Disease

## 2018-05-04 NOTE — Telephone Encounter (Signed)
Spoke with patient, made aware of results of CT per RA. Voiced understanding. Nothing further needed at this time.

## 2018-05-08 ENCOUNTER — Telehealth: Payer: Self-pay | Admitting: Pulmonary Disease

## 2018-05-08 NOTE — Telephone Encounter (Signed)
ATC no voice mail will try to call back.

## 2018-05-09 NOTE — Telephone Encounter (Signed)
I see that results were reviewed with patient in previous phone note. Attempted to call patient, no answer, left a voicemail that if she has any questions to give [place] a call back. Will close this encounter.

## 2018-05-09 NOTE — Telephone Encounter (Signed)
[phone] pt returning call and says that it is okay to leave msg/ results on  answering machine the number she left to leave msg was [phone].Hillery Hunter

## 2018-05-09 NOTE — Telephone Encounter (Signed)
LMOm X1

## 2018-05-12 ENCOUNTER — Ambulatory Visit: Payer: Medicare HMO | Admitting: Pulmonary Disease

## 2018-05-16 DIAGNOSIS — R69 Illness, unspecified: Secondary | ICD-10-CM | POA: Diagnosis not present

## 2018-05-25 ENCOUNTER — Ambulatory Visit: Payer: Medicare HMO | Admitting: Pulmonary Disease

## 2018-05-26 DIAGNOSIS — M25512 Pain in left shoulder: Secondary | ICD-10-CM | POA: Diagnosis not present

## 2018-05-26 DIAGNOSIS — M7542 Impingement syndrome of left shoulder: Secondary | ICD-10-CM | POA: Diagnosis not present

## 2018-07-04 DIAGNOSIS — Z09 Encounter for follow-up examination after completed treatment for conditions other than malignant neoplasm: Secondary | ICD-10-CM | POA: Diagnosis not present

## 2018-07-04 DIAGNOSIS — N3941 Urge incontinence: Secondary | ICD-10-CM | POA: Diagnosis not present

## 2018-07-04 DIAGNOSIS — N952 Postmenopausal atrophic vaginitis: Secondary | ICD-10-CM | POA: Diagnosis not present

## 2018-07-04 DIAGNOSIS — N390 Urinary tract infection, site not specified: Secondary | ICD-10-CM | POA: Diagnosis not present

## 2018-09-11 ENCOUNTER — Ambulatory Visit: Payer: Medicare HMO | Admitting: Gynecology

## 2018-09-11 ENCOUNTER — Encounter: Payer: Self-pay | Admitting: Gynecology

## 2018-09-11 VITALS — BP 118/76 | Ht 64.0 in | Wt 148.0 lb

## 2018-09-11 DIAGNOSIS — Z01419 Encounter for gynecological examination (general) (routine) without abnormal findings: Secondary | ICD-10-CM

## 2018-09-11 DIAGNOSIS — N952 Postmenopausal atrophic vaginitis: Secondary | ICD-10-CM

## 2018-09-11 NOTE — Progress Notes (Signed)
    Bianca [name] May 20, 1946 [id number]        72 y.o.  G0P0 for breast and pelvic exam.  Past medical history,surgical history, problem list, medications, allergies, family history and social history were all reviewed and documented as reviewed in the EPIC chart.  ROS:  Performed with pertinent positives and negatives included in the history, assessment and plan.   Additional significant findings : None   Exam: Bianca [name] assistant Vitals:   09/11/18 1537  BP: 118/76  Weight: 148 lb (67.1 kg)  Height: 5\' 4"  (1.626 m)   Body mass index is 25.4 kg/m.  General appearance:  Normal affect, orientation and appearance. Skin: Grossly normal HEENT: Without gross lesions.  No cervical or supraclavicular adenopathy. Thyroid normal.  Lungs:  Clear without wheezing, rales or rhonchi Cardiac: RR, without RMG Abdominal:  Soft, nontender, without masses, guarding, rebound, organomegaly or hernia Breasts:  Examined lying and sitting without masses, retractions, discharge or axillary adenopathy. Pelvic:  Ext, BUS, Vagina: With atrophic changes  Cervix: With atrophic changes  Uterus: Diverted, normal size, shape and contour, midline and mobile nontender   Adnexa: Without masses or tenderness    Anus and perineum: Normal   Rectovaginal: Normal sphincter tone without palpated masses or tenderness.    Assessment/Plan:  73 y.o. G0P0 female for breast and pelvic exam  1. Postmenopausal.  No significant menopausal symptoms or any vaginal bleeding. 2. DEXA 2017 through Dr. Pennie Banter office.  Continue to follow-up with him in reference to bone health.  She has an appointment to see him in March. 3. Pap smear 2019.  No Pap smear done today.  No history of abnormal Pap smears previously.  Options to stop screening per current screening guidelines based on age were reviewed. 4. Mammography coming due in April and I reminded her to schedule this.  Breast exam normal today. 5. Colonoscopy 2013.  Repeat  at their recommended interval. 6. Health maintenance.  No routine lab work done as patient reports this done elsewhere.  Follow-up 1 year, sooner as needed.   [name] MD, 4:23 PM 09/11/2018

## 2018-09-11 NOTE — Patient Instructions (Signed)
Follow-up in 1 year for annual exam, sooner as needed. 

## 2018-10-08 IMAGING — CT CT MAXILLOFACIAL W/O CM
3 series · 16 of 40 positions shown, 19 images · non-contrast
Comparison: None.

CLINICAL DATA: Chronic sinusitis. Bronchitis with drainage since
January 2016.

EXAM:
CT MAXILLOFACIAL WITHOUT CONTRAST
TECHNIQUE: Multidetector CT imaging of the maxillofacial structures was
performed. Multiplanar CT image reconstructions were also generated.

[Series 3: axial soft 1.25 · axial · 0.49mm/px · z∈[-26,+17]mm · 3 of 145 slices shown]
[im 17/145  brain]
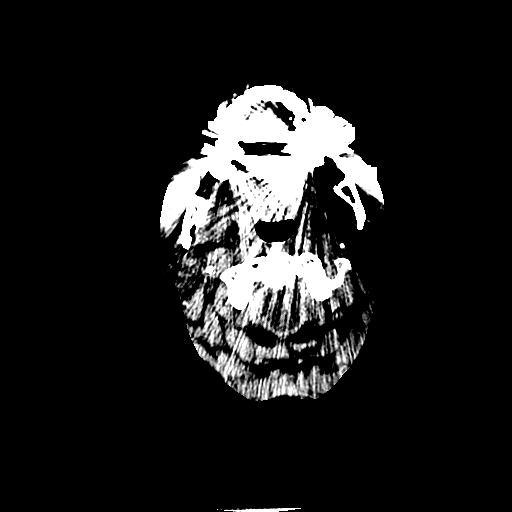
[im 34/145  brain]
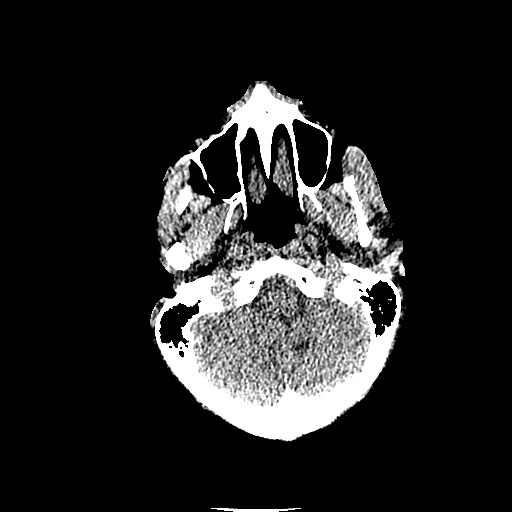
[im 51/145  brain]
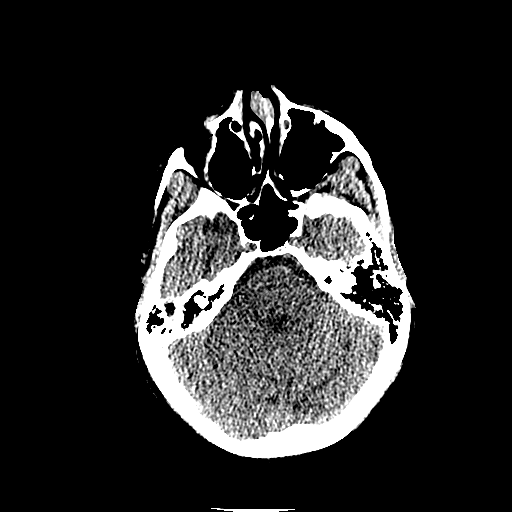

[Series 601: coronal facial · coronal · 0.49mm/px · 3 of 114 slices shown]
[im 38/114  bone]
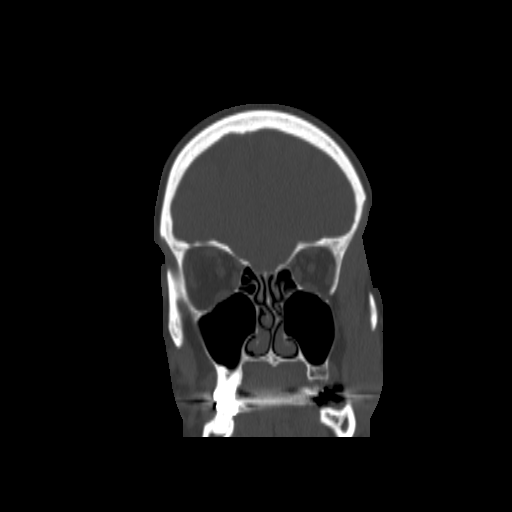
[im 51/114  bone]
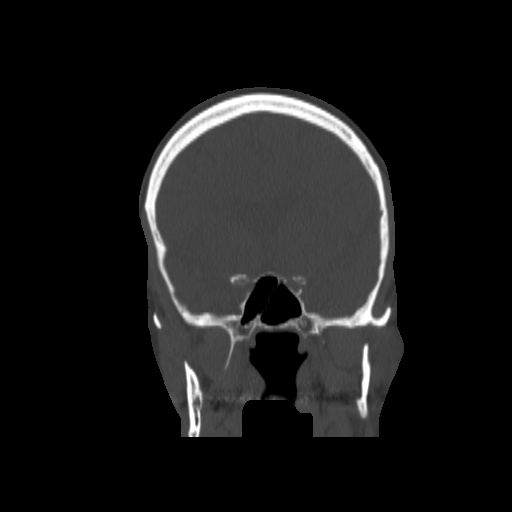
[im 63/114  bone]
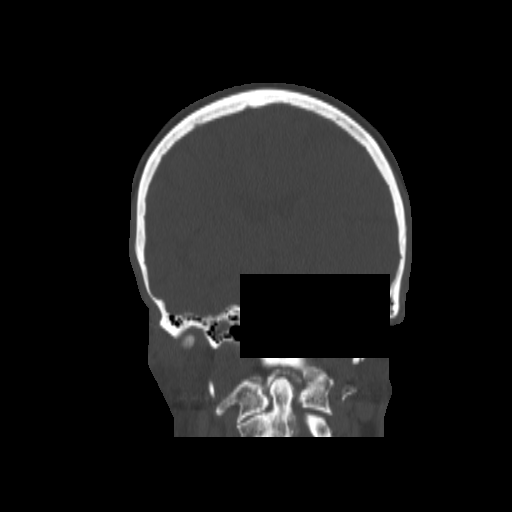

[Series 602: sagittal facial · sagittal · 0.49mm/px · 10 of 93 slices shown, 13 images]
[im 9/93  brain]
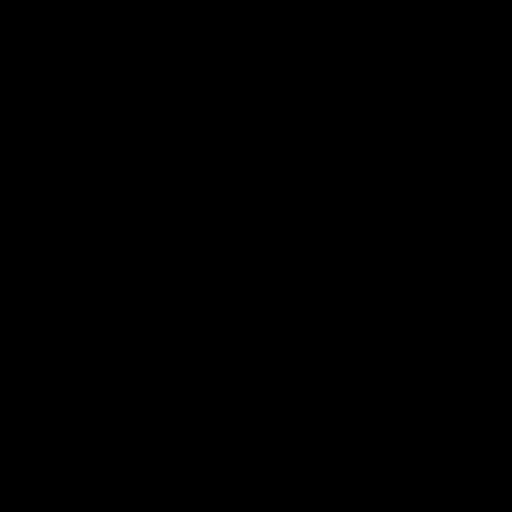
[im 9/93  bone]
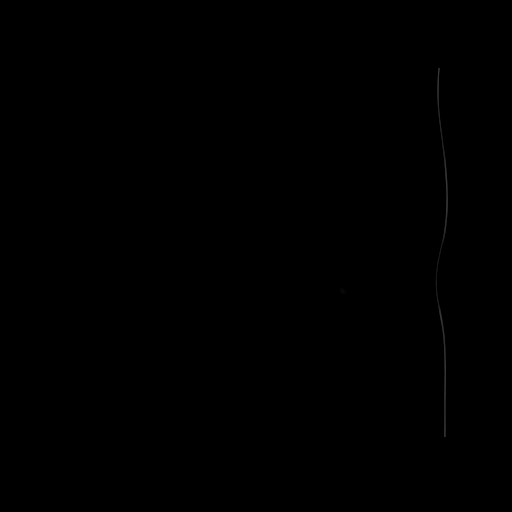
[im 17/93  bone]
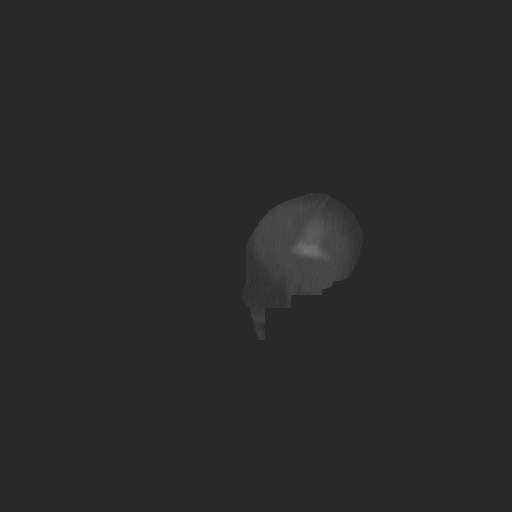
[im 26/93  bone]
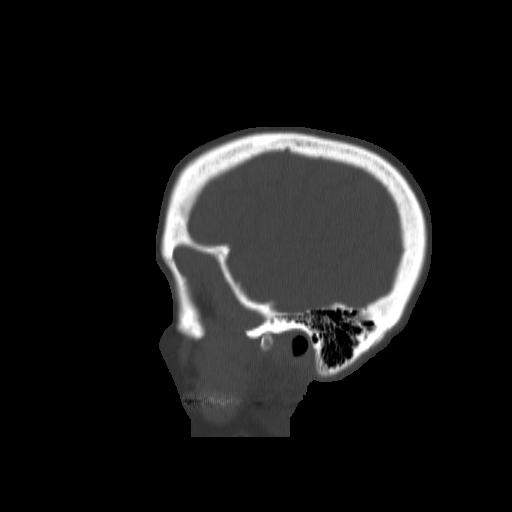
[im 34/93  bone]
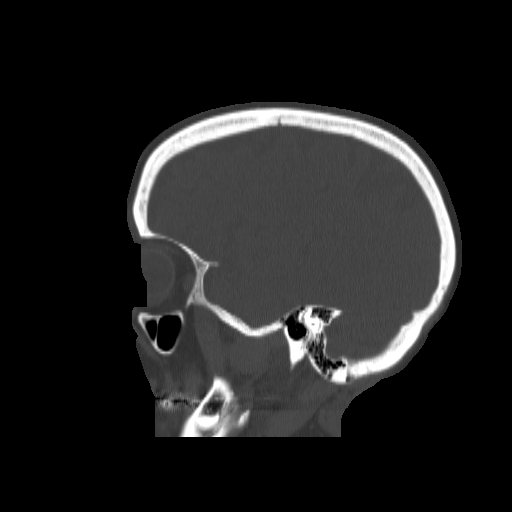
[im 42/93  brain]
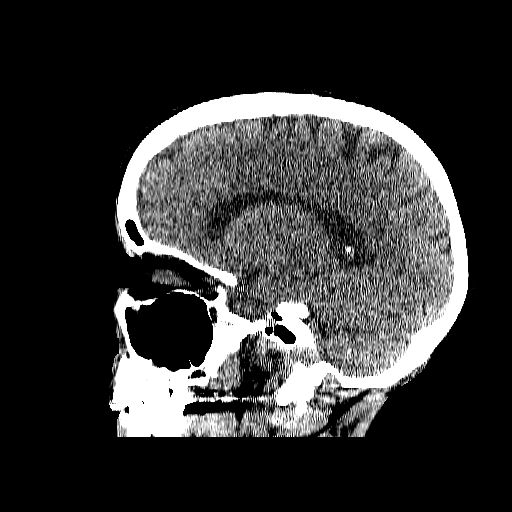
[im 42/93  bone]
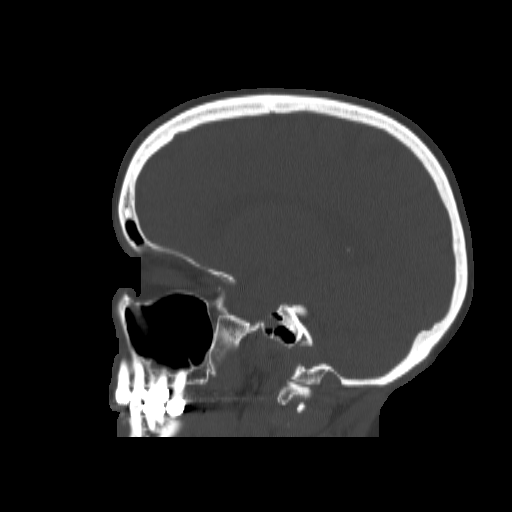
[im 51/93  bone]
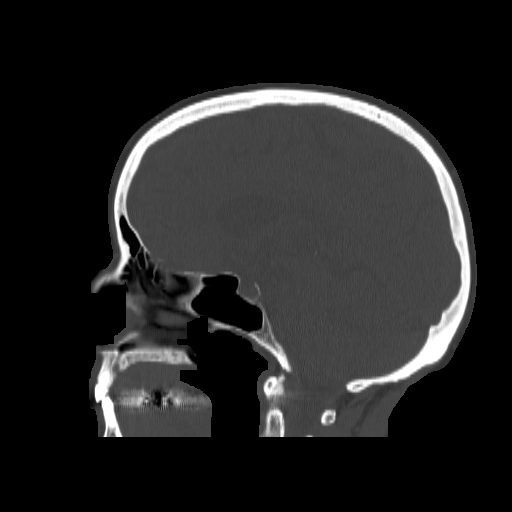
[im 59/93  bone]
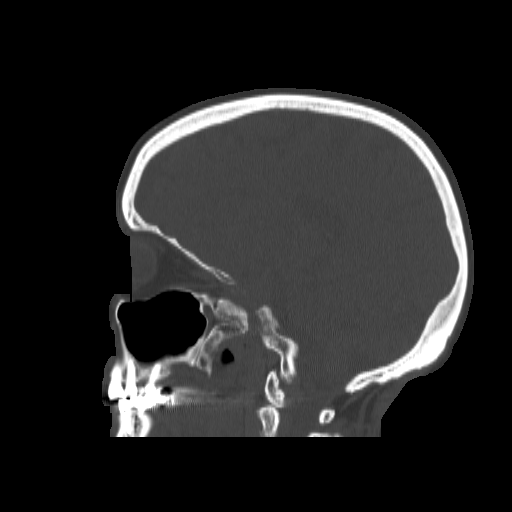
[im 67/93  bone]
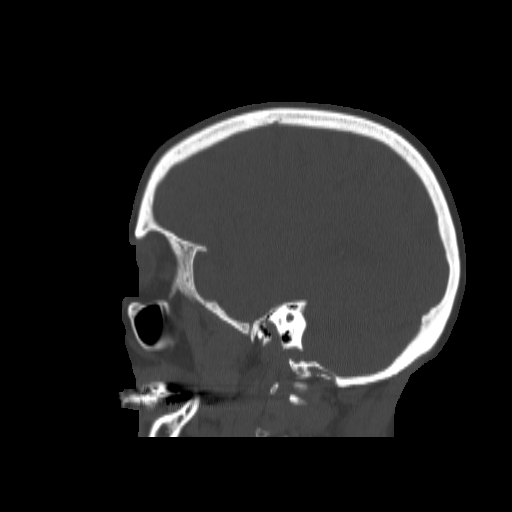
[im 76/93  brain]
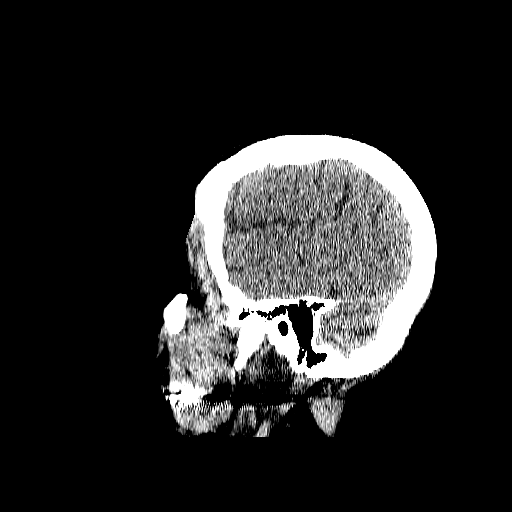
[im 76/93  bone]
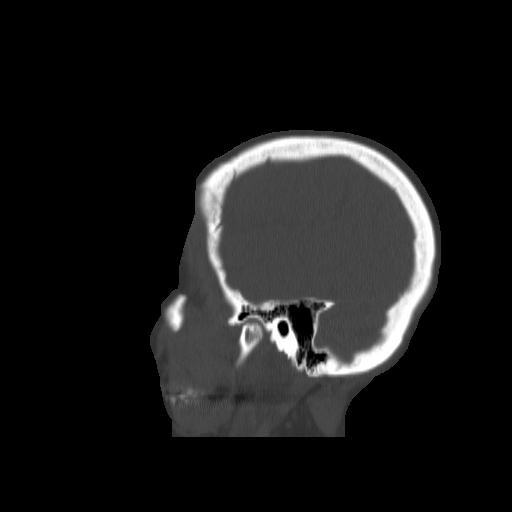
[im 84/93  bone]
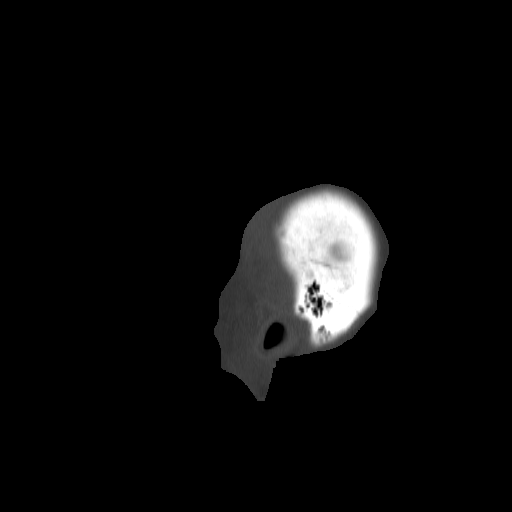

[16 of 40 positions shown; findings below may reference images not displayed]

FINDINGS: Osseous: No acute or destructive finding.

Orbits: Negative

Sinuses: Stenotic nasal cavity from medialization of the medial
walls of the maxillary sinuses. There is leftward nasal septal
deviation and spurring contacting the lateral nasal cavity wall. The
left middle turbinate is truncated.

Maxillary infundibular are patent, with mild narrowing from mucosal
thickening. No infraorbital air cell.

Frontal ethmoidal recesses are patent.

Sphenoid sinus ostia are patent.

There is minor mucosal thickening in the floors of the maxillary
sinuses. Probable mucous retention cyst on the medial wall left
maxillary sinus.

Intact medial orbital walls.  Symmetric olfactory recess depth.

Soft tissues: Negative

Intracranial: Negative
IMPRESSION: 1. Minor mucosal thickening in the sinuses without sinus
obstruction.
2. Narrow nasal cavity with leftward septal deviation and spurring
contacting the left nasal cavity wall.

## 2018-10-19 DIAGNOSIS — I10 Essential (primary) hypertension: Secondary | ICD-10-CM | POA: Diagnosis not present

## 2018-10-19 DIAGNOSIS — E7800 Pure hypercholesterolemia, unspecified: Secondary | ICD-10-CM | POA: Diagnosis not present

## 2018-10-26 DIAGNOSIS — Z780 Asymptomatic menopausal state: Secondary | ICD-10-CM | POA: Diagnosis not present

## 2018-10-26 DIAGNOSIS — Z13820 Encounter for screening for osteoporosis: Secondary | ICD-10-CM | POA: Diagnosis not present

## 2018-10-26 DIAGNOSIS — K219 Gastro-esophageal reflux disease without esophagitis: Secondary | ICD-10-CM | POA: Diagnosis not present

## 2018-10-26 DIAGNOSIS — J849 Interstitial pulmonary disease, unspecified: Secondary | ICD-10-CM | POA: Diagnosis not present

## 2018-10-26 DIAGNOSIS — K589 Irritable bowel syndrome without diarrhea: Secondary | ICD-10-CM | POA: Diagnosis not present

## 2018-10-26 DIAGNOSIS — J438 Other emphysema: Secondary | ICD-10-CM | POA: Diagnosis not present

## 2018-10-26 DIAGNOSIS — J302 Other seasonal allergic rhinitis: Secondary | ICD-10-CM | POA: Diagnosis not present

## 2018-10-26 DIAGNOSIS — E7800 Pure hypercholesterolemia, unspecified: Secondary | ICD-10-CM | POA: Diagnosis not present

## 2018-10-26 DIAGNOSIS — Z0000 Encounter for general adult medical examination without abnormal findings: Secondary | ICD-10-CM | POA: Diagnosis not present

## 2018-10-26 DIAGNOSIS — I10 Essential (primary) hypertension: Secondary | ICD-10-CM | POA: Diagnosis not present

## 2018-10-26 DIAGNOSIS — I2510 Atherosclerotic heart disease of native coronary artery without angina pectoris: Secondary | ICD-10-CM | POA: Diagnosis not present

## 2018-10-31 DIAGNOSIS — R69 Illness, unspecified: Secondary | ICD-10-CM | POA: Diagnosis not present

## 2018-12-18 DIAGNOSIS — S82034A Nondisplaced transverse fracture of right patella, initial encounter for closed fracture: Secondary | ICD-10-CM | POA: Diagnosis not present

## 2018-12-18 DIAGNOSIS — M25561 Pain in right knee: Secondary | ICD-10-CM | POA: Diagnosis not present

## 2019-01-10 DIAGNOSIS — R69 Illness, unspecified: Secondary | ICD-10-CM | POA: Diagnosis not present

## 2019-01-22 DIAGNOSIS — S82014D Nondisplaced osteochondral fracture of right patella, subsequent encounter for closed fracture with routine healing: Secondary | ICD-10-CM | POA: Diagnosis not present

## 2019-01-22 DIAGNOSIS — M25561 Pain in right knee: Secondary | ICD-10-CM | POA: Diagnosis not present

## 2019-02-27 DIAGNOSIS — Z1231 Encounter for screening mammogram for malignant neoplasm of breast: Secondary | ICD-10-CM | POA: Diagnosis not present

## 2019-05-03 DIAGNOSIS — M79671 Pain in right foot: Secondary | ICD-10-CM | POA: Diagnosis not present

## 2019-05-03 DIAGNOSIS — M19042 Primary osteoarthritis, left hand: Secondary | ICD-10-CM | POA: Diagnosis not present

## 2019-05-03 DIAGNOSIS — M25512 Pain in left shoulder: Secondary | ICD-10-CM | POA: Diagnosis not present

## 2019-05-03 DIAGNOSIS — M79672 Pain in left foot: Secondary | ICD-10-CM | POA: Diagnosis not present

## 2019-05-03 DIAGNOSIS — M19071 Primary osteoarthritis, right ankle and foot: Secondary | ICD-10-CM | POA: Diagnosis not present

## 2019-05-03 DIAGNOSIS — I10 Essential (primary) hypertension: Secondary | ICD-10-CM | POA: Diagnosis not present

## 2019-05-03 DIAGNOSIS — M069 Rheumatoid arthritis, unspecified: Secondary | ICD-10-CM | POA: Diagnosis not present

## 2019-05-03 DIAGNOSIS — M79642 Pain in left hand: Secondary | ICD-10-CM | POA: Diagnosis not present

## 2019-05-03 DIAGNOSIS — M25572 Pain in left ankle and joints of left foot: Secondary | ICD-10-CM | POA: Diagnosis not present

## 2019-05-03 DIAGNOSIS — M19072 Primary osteoarthritis, left ankle and foot: Secondary | ICD-10-CM | POA: Diagnosis not present

## 2019-05-03 DIAGNOSIS — M79641 Pain in right hand: Secondary | ICD-10-CM | POA: Diagnosis not present

## 2019-05-03 DIAGNOSIS — M359 Systemic involvement of connective tissue, unspecified: Secondary | ICD-10-CM | POA: Diagnosis not present

## 2019-05-03 DIAGNOSIS — M19041 Primary osteoarthritis, right hand: Secondary | ICD-10-CM | POA: Diagnosis not present

## 2019-05-03 DIAGNOSIS — M25571 Pain in right ankle and joints of right foot: Secondary | ICD-10-CM | POA: Diagnosis not present

## 2019-05-03 DIAGNOSIS — M1990 Unspecified osteoarthritis, unspecified site: Secondary | ICD-10-CM | POA: Diagnosis not present

## 2019-05-03 DIAGNOSIS — J849 Interstitial pulmonary disease, unspecified: Secondary | ICD-10-CM | POA: Diagnosis not present

## 2019-05-03 DIAGNOSIS — E785 Hyperlipidemia, unspecified: Secondary | ICD-10-CM | POA: Diagnosis not present

## 2019-05-04 DIAGNOSIS — R69 Illness, unspecified: Secondary | ICD-10-CM | POA: Diagnosis not present

## 2019-05-23 ENCOUNTER — Encounter: Payer: Self-pay | Admitting: Gynecology

## 2019-05-31 DIAGNOSIS — S82001A Unspecified fracture of right patella, initial encounter for closed fracture: Secondary | ICD-10-CM | POA: Diagnosis not present

## 2019-05-31 DIAGNOSIS — J302 Other seasonal allergic rhinitis: Secondary | ICD-10-CM | POA: Diagnosis not present

## 2019-07-16 DIAGNOSIS — B078 Other viral warts: Secondary | ICD-10-CM | POA: Diagnosis not present

## 2019-07-16 DIAGNOSIS — D485 Neoplasm of uncertain behavior of skin: Secondary | ICD-10-CM | POA: Diagnosis not present

## 2019-07-26 DIAGNOSIS — H16223 Keratoconjunctivitis sicca, not specified as Sjogren's, bilateral: Secondary | ICD-10-CM | POA: Diagnosis not present

## 2019-09-05 ENCOUNTER — Ambulatory Visit: Payer: Medicare HMO

## 2019-09-06 ENCOUNTER — Ambulatory Visit: Payer: Medicare HMO | Attending: Internal Medicine

## 2019-09-06 DIAGNOSIS — Z23 Encounter for immunization: Secondary | ICD-10-CM | POA: Insufficient documentation

## 2019-09-06 NOTE — Progress Notes (Signed)
   Covid-19 Vaccination Clinic  Name:  Bianca [name]    MRN: [id number] DOB: Jul 05, 1946  09/06/2019  [name] was observed post Covid-19 immunization for 15 minutes without incidence. She was provided with Vaccine Information Sheet and instruction to access the V-Safe system.   [name] was instructed to call 911 with any severe reactions post vaccine: Marland Kitchen Difficulty breathing  . Swelling of your face and throat  . A fast heartbeat  . A bad rash all over your body  . Dizziness and weakness    Immunizations Administered    Name Date Dose VIS Date Route   Pfizer COVID-19 Vaccine 09/06/2019  4:19 PM 0.3 mL 07/27/2019 Intramuscular   Manufacturer: [place]   Lot: [id number]   [place]: [id number]

## 2019-09-12 ENCOUNTER — Other Ambulatory Visit: Payer: Self-pay

## 2019-09-13 ENCOUNTER — Encounter: Payer: Medicare HMO | Admitting: Obstetrics and Gynecology

## 2019-09-27 ENCOUNTER — Ambulatory Visit: Payer: Medicare HMO | Attending: Internal Medicine

## 2019-09-27 DIAGNOSIS — Z23 Encounter for immunization: Secondary | ICD-10-CM

## 2019-09-27 NOTE — Progress Notes (Signed)
   Covid-19 Vaccination Clinic  Name:  Bianca [name]    MRN: [id number] DOB: September 22, 1945  09/27/2019  [name] was observed post Covid-19 immunization for 15 minutes without incidence. She was provided with Vaccine Information Sheet and instruction to access the V-Safe system.   [name] was instructed to call 911 with any severe reactions post vaccine: Marland Kitchen Difficulty breathing  . Swelling of your face and throat  . A fast heartbeat  . A bad rash all over your body  . Dizziness and weakness    Immunizations Administered    Name Date Dose VIS Date Route   Pfizer COVID-19 Vaccine 09/27/2019  3:33 PM 0.3 mL 07/27/2019 Intramuscular   Manufacturer: [place]   Lot: [id number]   [place]: [id number]

## 2019-10-09 DIAGNOSIS — I8311 Varicose veins of right lower extremity with inflammation: Secondary | ICD-10-CM | POA: Diagnosis not present

## 2019-10-09 DIAGNOSIS — I8312 Varicose veins of left lower extremity with inflammation: Secondary | ICD-10-CM | POA: Diagnosis not present

## 2019-10-18 DIAGNOSIS — I83813 Varicose veins of bilateral lower extremities with pain: Secondary | ICD-10-CM | POA: Diagnosis not present

## 2019-10-18 DIAGNOSIS — I8312 Varicose veins of left lower extremity with inflammation: Secondary | ICD-10-CM | POA: Diagnosis not present

## 2019-10-18 DIAGNOSIS — I8311 Varicose veins of right lower extremity with inflammation: Secondary | ICD-10-CM | POA: Diagnosis not present

## 2019-10-26 DIAGNOSIS — N390 Urinary tract infection, site not specified: Secondary | ICD-10-CM | POA: Diagnosis not present

## 2019-10-26 DIAGNOSIS — I10 Essential (primary) hypertension: Secondary | ICD-10-CM | POA: Diagnosis not present

## 2019-10-26 DIAGNOSIS — E7800 Pure hypercholesterolemia, unspecified: Secondary | ICD-10-CM | POA: Diagnosis not present

## 2019-10-29 ENCOUNTER — Ambulatory Visit: Payer: Medicare HMO | Admitting: Cardiovascular Disease

## 2019-10-29 NOTE — Progress Notes (Deleted)
Cardiology Office Note:    Date:  10/29/2019   ID:  [name], DOB Aug 02, 1946, MRN [id number]  PCP:  Deland Pretty, MD  Cardiologist:  Sherren Mocha, MD  Electrophysiologist:  None   Referring MD: Deland Pretty, MD   No chief complaint on file. ***  History of Present Illness:    Bianca [name] is a 74 y.o. female with a hx of nonobstructive CAD (diagnosed by cardiac CTA), HTN, and mixed hyperlipidemia. She presents for follow-up today, last seen here in 2018.   Past Medical History:  Diagnosis Date  . Arthritis   . Elevated cholesterol   . Hypertension     Past Surgical History:  Procedure Laterality Date  . ABDOMINAL SURGERY  1988   Exp. Laparotomy -Micro tuboplasty  . ANTERIOR CRUCIATE LIGAMENT REPAIR    . COSMETIC SURGERY    . hemorrdectomy    . NASAL SINUS SURGERY    . OOPHORECTOMY  2009   BSO cystadenofibroma  . PELVIC LAPAROSCOPY     DL with tubal lavage    Current Medications: No outpatient medications have been marked as taking for the 10/29/19 encounter (Appointment) with Sherren Mocha, MD.     Allergies:   Patient has no known allergies.   Social History   Socioeconomic History  . Marital status: Married    Spouse name: Not on file  . Number of children: Not on file  . Years of education: Not on file  . Highest education level: Not on file  Occupational History  . Not on file  Tobacco Use  . Smoking status: Former Research scientist (life sciences)  . Smokeless tobacco: Never Used  Substance and Sexual Activity  . Alcohol use: Yes    Alcohol/week: 0.0 standard drinks    Comment: Rare  . Drug use: No  . Sexual activity: Never    Birth control/protection: Post-menopausal    Comment: 1st intercourse 74 yo-Fewer than 5 partners  Other Topics Concern  . Not on file  Social History Narrative  . Not on file   Social Determinants of Health   Financial Resource Strain:   . Difficulty of Paying Living Expenses:   Food Insecurity:   . Worried About Ship broker in the Last Year:   . Arboriculturist in the Last Year:   Transportation Needs:   . Film/video editor (Medical):   Marland Kitchen Lack of Transportation (Non-Medical):   Physical Activity:   . Days of Exercise per Week:   . Minutes of Exercise per Session:   Stress:   . Feeling of Stress :   Social Connections:   . Frequency of Communication with Friends and Family:   . Frequency of Social Gatherings with Friends and Family:   . Attends Religious Services:   . Active Member of Clubs or Organizations:   . Attends Archivist Meetings:   Marland Kitchen Marital Status:      Family History: The patient's ***family history includes Heart disease in her father and paternal grandfather; Hypertension in her father; Pancreatic cancer in her mother; Stroke in her paternal grandfather.  ROS:   Please see the history of present illness.    *** All other systems reviewed and are negative.  EKGs/Labs/Other Studies Reviewed:    The following studies were reviewed today: ***  EKG:  EKG is *** ordered today.  The ekg ordered today demonstrates ***  Recent Labs: No results found for requested labs within last 8760 hours.  Recent Lipid  Panel    Component Value Date/Time   CHOL  12/06/2010 0840    147        ATP III CLASSIFICATION:  <200     mg/dL   Desirable  200-239  mg/dL   Borderline High  >=240    mg/dL   High          TRIG 86 12/06/2010 0840   HDL 63 12/06/2010 0840   CHOLHDL 2.3 12/06/2010 0840   VLDL 17 12/06/2010 0840   LDLCALC  12/06/2010 0840    67        Total Cholesterol/HDL:CHD Risk Coronary Heart Disease Risk Table                     Men   Women  1/2 Average Risk   3.4   3.3  Average Risk       5.0   4.4  2 X Average Risk   9.6   7.1  3 X Average Risk  23.4   11.0        Use the calculated Patient Ratio above and the CHD Risk Table to determine the patient's CHD Risk.        ATP III CLASSIFICATION (LDL):  <100     mg/dL   Optimal  100-129  mg/dL   Near or  Above                    Optimal  130-159  mg/dL   Borderline  160-189  mg/dL   High  >190     mg/dL   Very High    Physical Exam:    VS:  There were no vitals taken for this visit.    Wt Readings from Last 3 Encounters:  09/11/18 148 lb (67.1 kg)  10/21/17 140 lb (63.5 kg)  09/09/17 146 lb (66.2 kg)     GEN: *** Well nourished, well developed in no acute distress HEENT: Normal NECK: No JVD; No carotid bruits LYMPHATICS: No lymphadenopathy CARDIAC: ***RRR, no murmurs, rubs, gallops RESPIRATORY:  Clear to auscultation without rales, wheezing or rhonchi  ABDOMEN: Soft, non-tender, non-distended MUSCULOSKELETAL:  No edema; No deformity  SKIN: Warm and dry NEUROLOGIC:  Alert and oriented x 3 PSYCHIATRIC:  Normal affect   ASSESSMENT:    1. Coronary artery disease involving native coronary artery of native heart without angina pectoris   2. Mixed hyperlipidemia   3. Essential hypertension    PLAN:    In order of problems listed above:  1. ***   Medication Adjustments/Labs and Tests Ordered: Current medicines are reviewed at length with the patient today.  Concerns regarding medicines are outlined above.  No orders of the defined types were placed in this encounter.  No orders of the defined types were placed in this encounter.   There are no Patient Instructions on file for this visit.   Signed, Sherren Mocha, MD  10/29/2019 6:16 AM    [place]

## 2019-10-31 DIAGNOSIS — E785 Hyperlipidemia, unspecified: Secondary | ICD-10-CM | POA: Diagnosis not present

## 2019-10-31 DIAGNOSIS — R350 Frequency of micturition: Secondary | ICD-10-CM | POA: Diagnosis not present

## 2019-10-31 DIAGNOSIS — D72819 Decreased white blood cell count, unspecified: Secondary | ICD-10-CM | POA: Diagnosis not present

## 2019-10-31 DIAGNOSIS — J849 Interstitial pulmonary disease, unspecified: Secondary | ICD-10-CM | POA: Diagnosis not present

## 2019-10-31 DIAGNOSIS — I10 Essential (primary) hypertension: Secondary | ICD-10-CM | POA: Diagnosis not present

## 2019-10-31 DIAGNOSIS — D126 Benign neoplasm of colon, unspecified: Secondary | ICD-10-CM | POA: Diagnosis not present

## 2019-10-31 DIAGNOSIS — R918 Other nonspecific abnormal finding of lung field: Secondary | ICD-10-CM | POA: Diagnosis not present

## 2019-10-31 DIAGNOSIS — K219 Gastro-esophageal reflux disease without esophagitis: Secondary | ICD-10-CM | POA: Diagnosis not present

## 2019-10-31 DIAGNOSIS — Z0000 Encounter for general adult medical examination without abnormal findings: Secondary | ICD-10-CM | POA: Diagnosis not present

## 2019-10-31 DIAGNOSIS — M069 Rheumatoid arthritis, unspecified: Secondary | ICD-10-CM | POA: Diagnosis not present

## 2019-10-31 DIAGNOSIS — R8271 Bacteriuria: Secondary | ICD-10-CM | POA: Diagnosis not present

## 2019-11-20 NOTE — Progress Notes (Signed)
Cardiology Office Note    Date:  11/21/2019   ID:  Bianca [name], DOB Aug 26, 1945, MRN [id number]  PCP:  Deland Pretty, MD  Cardiologist:  Dr. Burt Knack  Chief Complaint: follow up for CAD  History of Present Illness:   Bianca [name] is a 74 y.o. female CAD, hypertension, hyperlipidemia, and interstitial lung disease seen for follow-up.  Hx of CAD based on a gated cardiac CTA several years ago. Last seen by Dr. Burt Knack 11/2016.   Last chest CT 04/2018 showed: Aortic atherosclerosis, in addition to 3 vessel coronary artery disease. Assessment for potential risk factor modification, dietary therapy or pharmacologic therapy may be warranted, if clinically indicated.  Here today for follow up.  Patient [place].  On field all the times without chest pain or shortness of breath.  Dealing with left shoulder pain.  Compliant with her medication.  No chest pain, shortness of breath, orthopnea, PND, syncope, lower extremity edema or melena.  Past Medical History:  Diagnosis Date  . Arthritis   . Elevated cholesterol   . Hypertension     Past Surgical History:  Procedure Laterality Date  . ABDOMINAL SURGERY  1988   Exp. Laparotomy -Micro tuboplasty  . ANTERIOR CRUCIATE LIGAMENT REPAIR    . COSMETIC SURGERY    . hemorrdectomy    . NASAL SINUS SURGERY    . OOPHORECTOMY  2009   BSO cystadenofibroma  . PELVIC LAPAROSCOPY     DL with tubal lavage    Current Medications: Prior to Admission medications   Medication Sig Start Date End Date Taking? Authorizing Provider  aspirin 81 MG tablet Take 81 mg by mouth every other day.    [provider]  Cholecalciferol (VITAMIN D PO) Take 1,000 Units by mouth daily.     [provider]  Cyanocobalamin (VITAMIN B 12 PO) Take 1,000 mcg by mouth daily.     [provider]  metoprolol succinate (TOPROL-XL) 50 MG 24 hr tablet TAKE 1/2 TO 1 TABLET BY MOUTH EVERY MORNING 08/07/15   [provider]    POTASSIUM BICARBONATE PO Take 550 mg by mouth daily.    [provider]  pyridoxine (B-6) 100 MG tablet Take 100 mg by mouth daily.    [provider]  rosuvastatin (CRESTOR) 20 MG tablet Take 20 mg by mouth daily.    [provider]  telmisartan (MICARDIS) 80 MG tablet Take 80 mg by mouth daily. 08/07/15   [provider]  vitamin C (ASCORBIC ACID) 500 MG tablet Take 500 mg by mouth daily.    [provider]    Allergies:   Patient has no known allergies.   Social History   Socioeconomic History  . Marital status: Married    Spouse name: Not on file  . Number of children: Not on file  . Years of education: Not on file  . Highest education level: Not on file  Occupational History  . Not on file  Tobacco Use  . Smoking status: Former Research scientist (life sciences)  . Smokeless tobacco: Never Used  Substance and Sexual Activity  . Alcohol use: Yes    Alcohol/week: 0.0 standard drinks    Comment: Rare  . Drug use: No  . Sexual activity: Never    Birth control/protection: Post-menopausal    Comment: 1st intercourse 74 yo-Fewer than 5 partners  Other Topics Concern  . Not on file  Social History Narrative  . Not on file   Social Determinants  of Health   Financial Resource Strain:   . Difficulty of Paying Living Expenses:   Food Insecurity:   . Worried About Charity fundraiser in the Last Year:   . Arboriculturist in the Last Year:   Transportation Needs:   . Film/video editor (Medical):   Marland Kitchen Lack of Transportation (Non-Medical):   Physical Activity:   . Days of Exercise per Week:   . Minutes of Exercise per Session:   Stress:   . Feeling of Stress :   Social Connections:   . Frequency of Communication with Friends and Family:   . Frequency of Social Gatherings with Friends and Family:   . Attends Religious Services:   . Active Member of Clubs or Organizations:   . Attends Archivist Meetings:   Marland Kitchen Marital Status:      Family  History:  The patient's family history includes Heart disease in her father and paternal grandfather; Hypertension in her father; Pancreatic cancer in her mother; Stroke in her paternal grandfather.   ROS:   Please see the history of present illness.    ROS All other systems reviewed and are negative.   PHYSICAL EXAM:   VS:  BP 120/70   Pulse (!) 54   Ht 5\' 4"  (1.626 m)   Wt 144 lb (65.3 kg)   SpO2 97%   BMI 24.72 kg/m    GEN: Well nourished, well developed, in no acute distress  HEENT: normal  Neck: no JVD, carotid bruits, or masses Cardiac: RRR; no murmurs, rubs, or gallops,no edema  Respiratory:  clear to auscultation bilaterally, normal work of breathing GI: soft, nontender, nondistended, + BS MS: no deformity or atrophy  Skin: warm and dry, no rash Neuro:  Alert and Oriented x 3, Strength and sensation are intact Psych: euthymic mood, full affect  Wt Readings from Last 3 Encounters:  11/21/19 144 lb (65.3 kg)  09/11/18 148 lb (67.1 kg)  10/21/17 140 lb (63.5 kg)      Studies/Labs Reviewed:   EKG:  EKG is ordered today.  The ekg ordered today demonstrates sinus bradycardia at rate of 54 bpm  Recent Labs: No results found for requested labs within last 8760 hours.   Lipid Panel    Component Value Date/Time   CHOL  12/06/2010 0840    147        ATP III CLASSIFICATION:  <200     mg/dL   Desirable  200-239  mg/dL   Borderline High  >=240    mg/dL   High          TRIG 86 12/06/2010 0840   HDL 63 12/06/2010 0840   CHOLHDL 2.3 12/06/2010 0840   VLDL 17 12/06/2010 0840   LDLCALC  12/06/2010 0840    67        Total Cholesterol/HDL:CHD Risk Coronary Heart Disease Risk Table                     Men   Women  1/2 Average Risk   3.4   3.3  Average Risk       5.0   4.4  2 X Average Risk   9.6   7.1  3 X Average Risk  23.4   11.0        Use the calculated Patient Ratio above and the CHD Risk Table to determine the patient's CHD Risk.        ATP III  CLASSIFICATION (  LDL):  <100     mg/dL   Optimal  100-129  mg/dL   Near or Above                    Optimal  130-159  mg/dL   Borderline  160-189  mg/dL   High  >190     mg/dL   Very High    Additional studies/ records that were reviewed today include:   As summarized above   ASSESSMENT & PLAN:    1. CAD No angina.  Patient is very active working in farms.  Continue aspirin, statin and beta-blocker.  2.  Hypertension -Blood pressure stable on current medication  3.  Hyperlipidemia -Continue Crestor -Followed by PCP  4.  Left shoulder pain - Previously had injection.  If she ever requires surgery she will be clear at acceptable risk.  May need phone call.   Medication Adjustments/Labs and Tests Ordered: Current medicines are reviewed at length with the patient today.  Concerns regarding medicines are outlined above.  Medication changes, Labs and Tests ordered today are listed in the Patient Instructions below. Patient Instructions  Medication Instructions:  Your physician recommends that you continue on your current medications as directed. Please refer to the Current Medication list given to you today.  *If you need a refill on your cardiac medications before your next appointment, please call your pharmacy*   Lab Work: None ordered  If you have labs (blood work) drawn today and your tests are completely normal, you will receive your results only by: Marland Kitchen MyChart Message (if you have MyChart) OR . A paper copy in the mail If you have any lab test that is abnormal or we need to change your treatment, we will call you to review the results.   Testing/Procedures: None ordered   Follow-Up: At [facility], you and your health needs are our priority.  As part of our continuing mission to provide you with exceptional heart care, we have created designated Provider Care Teams.  These Care Teams include your primary Cardiologist (physician) and Advanced Practice Providers  (APPs -  Physician Assistants and Nurse Practitioners) who all work together to provide you with the care you need, when you need it.  We recommend signing up for the patient portal called "MyChart".  Sign up information is provided on this After Visit Summary.  MyChart is used to connect with patients for Virtual Visits (Telemedicine).  Patients are able to view lab/test results, encounter notes, upcoming appointments, etc.  Non-urgent messages can be sent to your provider as well.   To learn more about what you can do with MyChart, go to [email or web address].    Your next appointment:   12 month(s)  The format for your next appointment:   In Person  Provider:   You may see Sherren Mocha, MD or one of the following Advanced Practice Providers on your designated Care Team:    Richardson Dopp, PA-C  [place], [place]  Daune Perch, NP    Other Instructions      Weston Brass Leanor Kail, [place]  11/21/2019 12:20 PM    [address], [place], [place]  [address] Phone: [phone]; Fax: [phone]

## 2019-11-21 ENCOUNTER — Ambulatory Visit: Payer: Medicare HMO | Admitting: Physician Assistant

## 2019-11-21 ENCOUNTER — Encounter: Payer: Self-pay | Admitting: Physician Assistant

## 2019-11-21 ENCOUNTER — Other Ambulatory Visit: Payer: Self-pay

## 2019-11-21 VITALS — BP 120/70 | HR 54 | Ht 64.0 in | Wt 144.0 lb

## 2019-11-21 DIAGNOSIS — E782 Mixed hyperlipidemia: Secondary | ICD-10-CM | POA: Diagnosis not present

## 2019-11-21 DIAGNOSIS — I10 Essential (primary) hypertension: Secondary | ICD-10-CM

## 2019-11-21 DIAGNOSIS — I2510 Atherosclerotic heart disease of native coronary artery without angina pectoris: Secondary | ICD-10-CM | POA: Diagnosis not present

## 2019-11-21 NOTE — Patient Instructions (Signed)
Medication Instructions:  Your physician recommends that you continue on your current medications as directed. Please refer to the Current Medication list given to you today.  *If you need a refill on your cardiac medications before your next appointment, please call your pharmacy*   Lab Work: None ordered  If you have labs (blood work) drawn today and your tests are completely normal, you will receive your results only by: Marland Kitchen MyChart Message (if you have MyChart) OR . A paper copy in the mail If you have any lab test that is abnormal or we need to change your treatment, we will call you to review the results.   Testing/Procedures: None ordered   Follow-Up: At [facility], you and your health needs are our priority.  As part of our continuing mission to provide you with exceptional heart care, we have created designated Provider Care Teams.  These Care Teams include your primary Cardiologist (physician) and Advanced Practice Providers (APPs -  Physician Assistants and Nurse Practitioners) who all work together to provide you with the care you need, when you need it.  We recommend signing up for the patient portal called "MyChart".  Sign up information is provided on this After Visit Summary.  MyChart is used to connect with patients for Virtual Visits (Telemedicine).  Patients are able to view lab/test results, encounter notes, upcoming appointments, etc.  Non-urgent messages can be sent to your provider as well.   To learn more about what you can do with MyChart, go to [email or web address].    Your next appointment:   12 month(s)  The format for your next appointment:   In Person  Provider:   You may see Sherren Mocha, MD or one of the following Advanced Practice Providers on your designated Care Team:    Richardson Dopp, PA-C  [place], [place]  Daune Perch, NP    Other Instructions

## 2019-12-20 DIAGNOSIS — D649 Anemia, unspecified: Secondary | ICD-10-CM | POA: Diagnosis not present

## 2019-12-20 DIAGNOSIS — Z86010 Personal history of colonic polyps: Secondary | ICD-10-CM | POA: Diagnosis not present

## 2020-01-30 DIAGNOSIS — I8311 Varicose veins of right lower extremity with inflammation: Secondary | ICD-10-CM | POA: Diagnosis not present

## 2020-01-30 DIAGNOSIS — I83811 Varicose veins of right lower extremities with pain: Secondary | ICD-10-CM | POA: Diagnosis not present

## 2020-02-04 DIAGNOSIS — I8311 Varicose veins of right lower extremity with inflammation: Secondary | ICD-10-CM | POA: Diagnosis not present

## 2020-02-18 DIAGNOSIS — R69 Illness, unspecified: Secondary | ICD-10-CM | POA: Diagnosis not present

## 2020-02-20 DIAGNOSIS — I8312 Varicose veins of left lower extremity with inflammation: Secondary | ICD-10-CM | POA: Diagnosis not present

## 2020-02-20 DIAGNOSIS — I83812 Varicose veins of left lower extremities with pain: Secondary | ICD-10-CM | POA: Diagnosis not present

## 2020-02-29 DIAGNOSIS — I8311 Varicose veins of right lower extremity with inflammation: Secondary | ICD-10-CM | POA: Diagnosis not present

## 2020-02-29 DIAGNOSIS — I83811 Varicose veins of right lower extremities with pain: Secondary | ICD-10-CM | POA: Diagnosis not present

## 2020-03-08 DIAGNOSIS — Z1231 Encounter for screening mammogram for malignant neoplasm of breast: Secondary | ICD-10-CM | POA: Diagnosis not present

## 2020-03-11 DIAGNOSIS — Z87891 Personal history of nicotine dependence: Secondary | ICD-10-CM | POA: Diagnosis not present

## 2020-03-11 DIAGNOSIS — R69 Illness, unspecified: Secondary | ICD-10-CM | POA: Diagnosis not present

## 2020-03-11 DIAGNOSIS — E785 Hyperlipidemia, unspecified: Secondary | ICD-10-CM | POA: Diagnosis not present

## 2020-03-11 DIAGNOSIS — Z008 Encounter for other general examination: Secondary | ICD-10-CM | POA: Diagnosis not present

## 2020-03-11 DIAGNOSIS — Z8249 Family history of ischemic heart disease and other diseases of the circulatory system: Secondary | ICD-10-CM | POA: Diagnosis not present

## 2020-03-11 DIAGNOSIS — Z823 Family history of stroke: Secondary | ICD-10-CM | POA: Diagnosis not present

## 2020-03-11 DIAGNOSIS — N3941 Urge incontinence: Secondary | ICD-10-CM | POA: Diagnosis not present

## 2020-03-11 DIAGNOSIS — Z809 Family history of malignant neoplasm, unspecified: Secondary | ICD-10-CM | POA: Diagnosis not present

## 2020-03-11 DIAGNOSIS — J302 Other seasonal allergic rhinitis: Secondary | ICD-10-CM | POA: Diagnosis not present

## 2020-03-11 DIAGNOSIS — I10 Essential (primary) hypertension: Secondary | ICD-10-CM | POA: Diagnosis not present

## 2020-03-19 DIAGNOSIS — I8311 Varicose veins of right lower extremity with inflammation: Secondary | ICD-10-CM | POA: Diagnosis not present

## 2020-03-19 DIAGNOSIS — I824Z1 Acute embolism and thrombosis of unspecified deep veins of right distal lower extremity: Secondary | ICD-10-CM | POA: Diagnosis not present

## 2020-03-26 DIAGNOSIS — R69 Illness, unspecified: Secondary | ICD-10-CM | POA: Diagnosis not present

## 2020-04-16 DIAGNOSIS — I8311 Varicose veins of right lower extremity with inflammation: Secondary | ICD-10-CM | POA: Diagnosis not present

## 2020-05-14 DIAGNOSIS — I8312 Varicose veins of left lower extremity with inflammation: Secondary | ICD-10-CM | POA: Diagnosis not present

## 2020-06-02 DIAGNOSIS — R69 Illness, unspecified: Secondary | ICD-10-CM | POA: Diagnosis not present

## 2020-10-29 DIAGNOSIS — E7800 Pure hypercholesterolemia, unspecified: Secondary | ICD-10-CM | POA: Diagnosis not present

## 2020-10-29 DIAGNOSIS — I10 Essential (primary) hypertension: Secondary | ICD-10-CM | POA: Diagnosis not present

## 2020-12-26 DIAGNOSIS — Z0000 Encounter for general adult medical examination without abnormal findings: Secondary | ICD-10-CM | POA: Diagnosis not present

## 2020-12-26 DIAGNOSIS — N1831 Chronic kidney disease, stage 3a: Secondary | ICD-10-CM | POA: Diagnosis not present

## 2020-12-26 DIAGNOSIS — L578 Other skin changes due to chronic exposure to nonionizing radiation: Secondary | ICD-10-CM | POA: Diagnosis not present

## 2020-12-26 DIAGNOSIS — E785 Hyperlipidemia, unspecified: Secondary | ICD-10-CM | POA: Diagnosis not present

## 2020-12-26 DIAGNOSIS — J849 Interstitial pulmonary disease, unspecified: Secondary | ICD-10-CM | POA: Diagnosis not present

## 2020-12-26 DIAGNOSIS — I10 Essential (primary) hypertension: Secondary | ICD-10-CM | POA: Diagnosis not present

## 2020-12-26 DIAGNOSIS — M069 Rheumatoid arthritis, unspecified: Secondary | ICD-10-CM | POA: Diagnosis not present

## 2020-12-26 DIAGNOSIS — I700 Atherosclerosis of aorta: Secondary | ICD-10-CM | POA: Diagnosis not present

## 2020-12-26 DIAGNOSIS — I2510 Atherosclerotic heart disease of native coronary artery without angina pectoris: Secondary | ICD-10-CM | POA: Diagnosis not present

## 2021-05-04 ENCOUNTER — Other Ambulatory Visit: Payer: Self-pay

## 2021-05-04 ENCOUNTER — Encounter (HOSPITAL_BASED_OUTPATIENT_CLINIC_OR_DEPARTMENT_OTHER): Payer: Self-pay | Admitting: Obstetrics and Gynecology

## 2021-05-04 ENCOUNTER — Emergency Department (HOSPITAL_BASED_OUTPATIENT_CLINIC_OR_DEPARTMENT_OTHER): Payer: Medicare HMO | Admitting: Radiology

## 2021-05-04 ENCOUNTER — Emergency Department (HOSPITAL_BASED_OUTPATIENT_CLINIC_OR_DEPARTMENT_OTHER)
Admission: EM | Admit: 2021-05-04 | Discharge: 2021-05-04 | Disposition: A | Discharge status: Home / Self Care | Payer: Medicare HMO | Attending: Emergency Medicine | Admitting: Emergency Medicine

## 2021-05-04 DIAGNOSIS — S62102A Fracture of unspecified carpal bone, left wrist, initial encounter for closed fracture: Secondary | ICD-10-CM | POA: Diagnosis not present

## 2021-05-04 DIAGNOSIS — S6992XA Unspecified injury of left wrist, hand and finger(s), initial encounter: Secondary | ICD-10-CM | POA: Diagnosis present

## 2021-05-04 DIAGNOSIS — Z7982 Long term (current) use of aspirin: Secondary | ICD-10-CM | POA: Diagnosis not present

## 2021-05-04 DIAGNOSIS — Z79899 Other long term (current) drug therapy: Secondary | ICD-10-CM | POA: Insufficient documentation

## 2021-05-04 DIAGNOSIS — S52612A Displaced fracture of left ulna styloid process, initial encounter for closed fracture: Secondary | ICD-10-CM | POA: Diagnosis not present

## 2021-05-04 DIAGNOSIS — I10 Essential (primary) hypertension: Secondary | ICD-10-CM | POA: Insufficient documentation

## 2021-05-04 DIAGNOSIS — Z87891 Personal history of nicotine dependence: Secondary | ICD-10-CM | POA: Insufficient documentation

## 2021-05-04 DIAGNOSIS — W1830XA Fall on same level, unspecified, initial encounter: Secondary | ICD-10-CM | POA: Diagnosis not present

## 2021-05-04 DIAGNOSIS — S6292XA Unspecified fracture of left wrist and hand, initial encounter for closed fracture: Secondary | ICD-10-CM | POA: Insufficient documentation

## 2021-05-04 DIAGNOSIS — S52572A Other intraarticular fracture of lower end of left radius, initial encounter for closed fracture: Secondary | ICD-10-CM | POA: Diagnosis not present

## 2021-05-04 HISTORY — DX: Fracture of unspecified carpal bone, left wrist, initial encounter for closed fracture: S62.102A

## 2021-05-04 MED ORDER — ONDANSETRON 4 MG PO TBDP: 4.0000 mg | ORAL_TABLET | Freq: Three times a day (TID) | ORAL | 0 refills | Status: DC | PRN

## 2021-05-04 MED ORDER — ONDANSETRON 4 MG PO TBDP
4.0000 mg | ORAL_TABLET | Freq: Once | ORAL | Status: AC
  Administered 2021-05-04: 4 mg via ORAL
  Filled 2021-05-04: qty 1

## 2021-05-04 MED ORDER — HYDROCODONE-ACETAMINOPHEN 5-325 MG PO TABS: 2.0000 | ORAL_TABLET | ORAL | 0 refills | Status: DC | PRN

## 2021-05-04 MED ORDER — OXYCODONE-ACETAMINOPHEN 5-325 MG PO TABS
1.0000 | ORAL_TABLET | Freq: Once | ORAL | Status: AC
  Administered 2021-05-04: 1 via ORAL
  Filled 2021-05-04: qty 1

## 2021-05-04 MED ORDER — LORAZEPAM 1 MG PO TABS
0.5000 mg | ORAL_TABLET | Freq: Once | ORAL | Status: AC
  Administered 2021-05-04: 0.5 mg via SUBLINGUAL
  Filled 2021-05-04: qty 1

## 2021-05-04 NOTE — ED Notes (Signed)
Pt discharged home after verbalizing understanding of discharge instructions; nad noted. 

## 2021-05-04 NOTE — ED Triage Notes (Signed)
Patient reports she fell about 45 minutes ago. Patient was pulling something from the window and the item split and she fell. Obvious injury to the left wrist and swelling

## 2021-05-04 NOTE — ED Provider Notes (Signed)
Bianca [name] Provider Note   CSN: [id number] Arrival date & time: 05/04/21  1234     History Chief Complaint  Patient presents with   Wrist Injury    Bianca [name] is a 75 y.o. female.  The history is provided by the patient.  Wrist Injury Location:  Wrist Wrist location:  L wrist Injury: yes   Time since incident:  4 hours Mechanism of injury: fall   Fall:    Fall occurred:  Standing   Impact surface:  Hard floor   Point of impact:  Outstretched arms Pain details:    Quality:  Sharp and throbbing   Severity:  Moderate   Timing:  Constant Handedness:  Left-handed Relieved by:  Nothing Worsened by:  Nothing Ineffective treatments:  None tried Associated symptoms: decreased range of motion and swelling   Associated symptoms: no back pain, no fever, no neck pain and no numbness    75 year old female presenting to the emergency department after a fall from standing.  The patient describes a [place] mechanism while working in her house.  The patient states that she was pulling something from a window when the abdomen split causing her to fall back landing on her outstretched arm.  She landed mostly on her left wrist and felt immediate sharp shooting pain and deformity.  She did not lose consciousness.  She endorses some pain in her left upper back, left shoulder, left elbow.  No neck pain, numbness or weakness.  Past Medical History:  Diagnosis Date   Arthritis    Elevated cholesterol    Hypertension     Patient Active Problem List   Diagnosis Date Noted   Chronic sinusitis 10/21/2017   ILD (interstitial lung disease) ([place]) 03/10/2016   Cystadenofibroma of ovary    Arthritis    Hyperlipidemia, mixed 05/03/2009   Essential hypertension 05/03/2009   CAD 05/03/2009    Past Surgical History:  Procedure Laterality Date   ABDOMINAL SURGERY  1988   Exp. Laparotomy -Micro tuboplasty   ANTERIOR CRUCIATE LIGAMENT REPAIR     COSMETIC SURGERY      hemorrdectomy     NASAL SINUS SURGERY     OOPHORECTOMY  2009   BSO cystadenofibroma   PELVIC LAPAROSCOPY     DL with tubal lavage     OB History     Gravida  0   Para      Term      Preterm      AB      Living         SAB      IAB      Ectopic      Multiple      Live Births              Family History  Problem Relation Age of Onset   Pancreatic cancer Mother    Hypertension Father    Heart disease Father    Heart disease Paternal Grandfather    Stroke Paternal Grandfather     Social History   Tobacco Use   Smoking status: Former   Smokeless tobacco: Never  Scientific laboratory technician Use: Never used  Substance Use Topics   Alcohol use: Yes    Alcohol/week: 0.0 standard drinks    Comment: Rare   Drug use: No    Home Medications Prior to Admission medications   Medication Sig Start Date End Date Taking? Authorizing Provider  HYDROcodone-acetaminophen (NORCO/VICODIN) 5-325 MG tablet  Take 2 tablets by mouth every 4 (four) hours as needed for up to 3 days. 05/04/21 05/07/21 Yes Regan Lemming, MD  ondansetron (ZOFRAN ODT) 4 MG disintegrating tablet Take 1 tablet (4 mg total) by mouth every 8 (eight) hours as needed for nausea or vomiting. 05/04/21  Yes Regan Lemming, MD  aspirin 81 MG tablet Take 81 mg by mouth every other day.    [provider]  Cholecalciferol (VITAMIN D PO) Take 1,000 Units by mouth daily.     [provider]  Cyanocobalamin (VITAMIN B 12 PO) Take 1,000 mcg by mouth daily.     [provider]  fexofenadine (ALLEGRA) 180 MG tablet Take by mouth as needed. allergies    [provider]  metoprolol succinate (TOPROL-XL) 50 MG 24 hr tablet TAKE 1/2 TO 1 TABLET BY MOUTH EVERY MORNING 08/07/15   [provider]  POTASSIUM BICARBONATE PO Take 550 mg by mouth daily.    [provider]  pyridoxine (B-6) 100 MG tablet Take 100 mg by mouth daily.    [provider]  rosuvastatin  (CRESTOR) 20 MG tablet Take 20 mg by mouth daily.    [provider]  telmisartan (MICARDIS) 80 MG tablet Take 80 mg by mouth daily. 08/07/15   [provider]  vitamin C (ASCORBIC ACID) 500 MG tablet Take 500 mg by mouth daily.    [provider]    Allergies    Patient has no known allergies.  Review of Systems   Review of Systems  Constitutional:  Negative for chills and fever.  HENT:  Negative for ear pain and sore throat.   Eyes:  Negative for pain and visual disturbance.  Respiratory:  Negative for cough and shortness of breath.   Cardiovascular:  Negative for chest pain and palpitations.  Gastrointestinal:  Negative for abdominal pain and vomiting.  Genitourinary:  Negative for dysuria and hematuria.  Musculoskeletal:  Positive for arthralgias and joint swelling. Negative for back pain and neck pain.  Skin:  Negative for color change and rash.  Neurological:  Negative for seizures and syncope.  All other systems reviewed and are negative.  Physical Exam Updated Vital Signs BP (!) 183/85 (BP Location: Right Arm)   Pulse 62   Temp 98 F (36.7 C) (Oral)   Resp 18   SpO2 98%   Physical Exam Vitals and nursing note reviewed.  Constitutional:      General: She is not in acute distress.    Appearance: She is well-developed.     Comments: GCS 15, ABC intact  HENT:     Head: Normocephalic and atraumatic.  Eyes:     Extraocular Movements: Extraocular movements intact.     Conjunctiva/sclera: Conjunctivae normal.     Pupils: Pupils are equal, round, and reactive to light.  Neck:     Comments: No midline tenderness to palpation of the cervical spine.  Range of motion intact Cardiovascular:     Rate and Rhythm: Normal rate and regular rhythm.     Heart sounds: No murmur heard. Pulmonary:     Effort: Pulmonary effort is normal. No respiratory distress.     Breath sounds: Normal breath sounds.  Chest:     Comments: Clavicles stable nontender to  AP compression.  Chest wall stable and nontender to AP and lateral compression. Abdominal:     Palpations: Abdomen is soft.     Tenderness: There is no abdominal tenderness.  Musculoskeletal:     Cervical back:  Neck supple.     Comments: No midline tenderness to palpation of the thoracic or lumbar spine.  No tenderness of the left shoulder or left elbow.  Intact range of motion of the left shoulder and left elbow joint.  Tenderness to palpation of the left wrist.  Obvious deformity and swelling present.  2+ radial pulses.  Intact motor function along the median, ulnar, radial nerve distributions.  Sensation grossly intact to light touch.  Skin:    General: Skin is warm and dry.  Neurological:     Mental Status: She is alert.     Comments: Cranial nerves II through XII grossly intact.  Moving all 4 extremities spontaneously.  Sensation grossly intact all 4 extremities    ED Results / Procedures / Treatments   Labs (all labs ordered are listed, but only abnormal results are displayed) Labs Reviewed - No data to display  EKG None  Radiology DG Wrist Complete Left  Result Date: 05/04/2021 CLINICAL DATA:  Fall with left wrist deformity EXAM: LEFT WRIST - COMPLETE 3+ VIEW; LEFT HAND - COMPLETE 3+ VIEW COMPARISON:  None. FINDINGS: Acute comminuted fracture of the distal radial metaphysis with intra-articular extension to the radiocarpal joint. Fracture is impacted with dorsal displacement of a dorsal fracture fragment. Acute mildly displaced fracture at the tip of the ulnar styloid. Carpal bones intact. No acute fracture or dislocation of the left hand. Mild degenerative changes. Diffuse soft tissue swelling at the wrist. IMPRESSION: 1. Acute, comminuted, mildly displaced and impacted intra-articular fracture of the distal radial metaphysis. 2. Acute mildly displaced fracture at the tip of the ulnar styloid. Electronically Signed   By: Davina Poke D.O.   On: 05/04/2021 13:40   DG Hand  Complete Left  Result Date: 05/04/2021 CLINICAL DATA:  Fall with left wrist deformity EXAM: LEFT WRIST - COMPLETE 3+ VIEW; LEFT HAND - COMPLETE 3+ VIEW COMPARISON:  None. FINDINGS: Acute comminuted fracture of the distal radial metaphysis with intra-articular extension to the radiocarpal joint. Fracture is impacted with dorsal displacement of a dorsal fracture fragment. Acute mildly displaced fracture at the tip of the ulnar styloid. Carpal bones intact. No acute fracture or dislocation of the left hand. Mild degenerative changes. Diffuse soft tissue swelling at the wrist. IMPRESSION: 1. Acute, comminuted, mildly displaced and impacted intra-articular fracture of the distal radial metaphysis. 2. Acute mildly displaced fracture at the tip of the ulnar styloid. Electronically Signed   By: Davina Poke D.O.   On: 05/04/2021 13:40    Procedures Procedures   Medications Ordered in ED Medications  oxyCODONE-acetaminophen (PERCOCET/ROXICET) 5-325 MG per tablet 1 tablet (1 tablet Oral Given 05/04/21 1306)  oxyCODONE-acetaminophen (PERCOCET/ROXICET) 5-325 MG per tablet 1 tablet (1 tablet Oral Given 05/04/21 1515)  ondansetron (ZOFRAN-ODT) disintegrating tablet 4 mg (4 mg Oral Given 05/04/21 1511)  LORazepam (ATIVAN) tablet 0.5 mg (0.5 mg Sublingual Given 05/04/21 1637)    ED Course  I have reviewed the triage vital signs and the nursing notes.  Pertinent labs & imaging results that were available during my care of the patient were reviewed by me and considered in my medical decision making (see chart for details).    MDM Rules/Calculators/A&P                           75 year old female with medical history as above presenting with a distal radius and ulnar styloid fracture after a [place] mechanism during a ground-level mechanical fall.  On  arrival, the patient's left wrist was neurovascularly intact.  X-ray imaging was obtained which revealed an acute comminuted mildly displaced impacted  intra-articular fracture of the distal radial metaphysis with an acute mildly displaced fracture at the tip of the ulnar styloid.  I spoke with Dr. Greta Doom of hand surgery who recommended placing the patient in a sugar-tong splint, no need for traction or manual reduction in the ED.  Follow-up plan in clinic in the next 1 to 2 days.  Norco provided for pain control.  Referral placed.  The patient was splinted and remained neurovascular intact status post splinting.   Final Clinical Impression(s) / ED Diagnoses Final diagnoses:  Closed fracture of left wrist, initial encounter    Rx / DC Orders ED Discharge Orders          Ordered    AMB referral to orthopedics        05/04/21 1536    HYDROcodone-acetaminophen (NORCO/VICODIN) 5-325 MG tablet  Every 4 hours PRN        05/04/21 1540    ondansetron (ZOFRAN ODT) 4 MG disintegrating tablet  Every 8 hours PRN        05/04/21 1624             Regan Lemming, MD 05/04/21 1709

## 2021-05-05 DIAGNOSIS — M25532 Pain in left wrist: Secondary | ICD-10-CM | POA: Diagnosis not present

## 2021-05-05 DIAGNOSIS — S52502A Unspecified fracture of the lower end of left radius, initial encounter for closed fracture: Secondary | ICD-10-CM | POA: Diagnosis not present

## 2021-05-06 ENCOUNTER — Encounter (HOSPITAL_BASED_OUTPATIENT_CLINIC_OR_DEPARTMENT_OTHER): Payer: Self-pay | Admitting: Orthopedic Surgery

## 2021-05-06 ENCOUNTER — Other Ambulatory Visit: Payer: Self-pay

## 2021-05-06 DIAGNOSIS — R413 Other amnesia: Secondary | ICD-10-CM

## 2021-05-06 DIAGNOSIS — Z789 Other specified health status: Secondary | ICD-10-CM

## 2021-05-06 HISTORY — DX: Other specified health status: Z78.9

## 2021-05-06 HISTORY — DX: Other amnesia: R41.3

## 2021-05-06 NOTE — Progress Notes (Addendum)
Spoke w/ via phone for pre-op interview---pt and sister in law peggy smith per patient request Lab needs dos----  I stat and ekg             Lab results------see below COVID test -----patient states asymptomatic no test needed Arrive at -------930 am 05-07-2021 NPO after MN NO Solid Food. Water  from MN until---830 am  Med rec completed Medications to take morning of surgery -----metoprolol succinate hydrocodone prn Diabetic medication ----- Patient instructed no nail polish to be worn day of surgery Patient instructed to bring photo id and insurance card day of surgery Patient aware to have Driver (ride ) / caregiver  driver pebby smith sister in law cell [phone] spouse archie caregiver   for 24 hours after surgery  Patient Special Instructions -----none Pre-Op special Istructions -----none Patient verbalized understanding of instructions that were given at this phone interview. Patient denies shortness of breath, chest pain, fever, cough at this phone interview.   Spoke with michael foster mda and reviewed the following records; [facility] cardiology 11-21-2019 epic, [facility] 10-21-2017 epic, chest ct 04-25-2018 epic, and pt medical history pt ok for surgery 05-07-2021 at [facility] per dr Legrand [place] foster mda.  Patient with memory issues signs own consents driver peggy smith sister in law to help with medications dos and spouse archie available by phone if needed cell [phone] had recent tongue surgery for cancer and cannot speak well)

## 2021-05-06 NOTE — H&P (Signed)
Preoperative History & Physical Exam  Surgeon: Matt Holmes, MD  Diagnosis: left distal radius and ulna fracture  Planned Procedure: Procedure(s) (LRB): OPEN REDUCTION INTERNAL FIXATION (ORIF) WRIST FRACTURE (Left)  History of Present Illness:   Patient is a 75 y.o. female with symptoms consistent with  left distal radius and ulna fracture who presents for surgical intervention. The risks, benefits and alternatives of surgical intervention were discussed and informed consent was obtained prior to surgery.  Past Medical History:  Past Medical History:  Diagnosis Date   Arthritis    Coronary artery disease    Elevated cholesterol    Hypertension    ILD (interstitial lung disease) ([place])    mild ild per lov dr Elsworth Soho pumonary 10-21-2017   Left wrist fracture 05/04/2021   in sling   Memory problem 05/06/2021   no diagnosis signs owns consent sister in law peggy Tamala Julian helps due to spouse recent tonue surgery no formal poa   Poor historian 05/06/2021   no diagnosis but sister in law peggy Tamala Julian helps due to spouse recent tongue surgery    Past Surgical History:  Past Surgical History:  Procedure Laterality Date   ABDOMINAL SURGERY  1988   Exp. Laparotomy -Micro tuboplasty   ANTERIOR CRUCIATE LIGAMENT REPAIR     COSMETIC SURGERY     hemorrdectomy     NASAL SINUS SURGERY     OOPHORECTOMY  2009   BSO cystadenofibroma   PELVIC LAPAROSCOPY     DL with tubal lavage    Medications:  Prior to Admission medications   Medication Sig Start Date End Date Taking? Authorizing Provider  OVER THE COUNTER MEDICATION Vitamin d 3 2 tabs daily   Yes [provider]  aspirin 81 MG tablet Take 81 mg by mouth daily.    [provider]  Cholecalciferol (VITAMIN D PO) Take 1,000 Units by mouth daily.     [provider]  Cyanocobalamin (VITAMIN B 12 PO) Take 1,000 mcg by mouth daily.     [provider]  fexofenadine (ALLEGRA) 180 MG tablet Take by mouth as  needed. allergies    [provider]  HYDROcodone-acetaminophen (NORCO/VICODIN) 5-325 MG tablet Take 2 tablets by mouth every 4 (four) hours as needed for up to 3 days. 05/04/21 05/07/21  Regan Lemming, MD  metoprolol succinate (TOPROL-XL) 50 MG 24 hr tablet daily. 08/07/15   [provider]  ondansetron (ZOFRAN ODT) 4 MG disintegrating tablet Take 1 tablet (4 mg total) by mouth every 8 (eight) hours as needed for nausea or vomiting. 05/04/21   Regan Lemming, MD  POTASSIUM BICARBONATE PO Take 550 mg by mouth daily.    [provider]  pyridoxine (B-6) 100 MG tablet Take 100 mg by mouth daily.    [provider]  rosuvastatin (CRESTOR) 20 MG tablet Take 20 mg by mouth at bedtime.    [provider]  telmisartan (MICARDIS) 80 MG tablet Take 80 mg by mouth daily. 08/07/15   [provider]  vitamin C (ASCORBIC ACID) 500 MG tablet Take 500 mg by mouth daily.    [provider]    Allergies:  Patient has no known allergies.  Review of Systems: Negative except per HPI.  Physical Exam: Alert and oriented, NAD Head and neck: no masses, normal alignment CV: pulse intact Pulm: no increased work of breathing, respirations even and unlabored Abdomen: non-distended Extremities: extremities warm and well perfused  LABS: No results found for this or any previous visit (  from the past 2160 hour(s)).   Complete History and Physical exam available in the office notes  Orene Desanctis

## 2021-05-06 NOTE — Anesthesia Preprocedure Evaluation (Addendum)
Anesthesia Evaluation  Patient identified by MRN, date of birth, ID band Patient awake    Reviewed: Allergy & Precautions, NPO status , Patient's Chart, lab work & pertinent test results  Airway Mallampati: II  TM Distance: >3 FB Neck ROM: Full    Dental no notable dental hx. (+) Teeth Intact, Dental Advisory Given   Pulmonary former smoker,    Pulmonary exam normal breath sounds clear to auscultation       Cardiovascular hypertension, Pt. on medications Normal cardiovascular exam Rhythm:Regular Rate:Normal     Neuro/Psych Memory Problems Hx taken w Care giverpresentnegative neurological ROS  negative psych ROS   GI/Hepatic negative GI ROS, Neg liver ROS,   Endo/Other    Renal/GU negative Renal ROS     Musculoskeletal  (+) Arthritis , Rheumatoid disorders,    Abdominal   Peds  Hematology   Anesthesia Other Findings   Reproductive/Obstetrics                          Anesthesia Physical Anesthesia Plan  ASA: 3  Anesthesia Plan: Regional   Post-op Pain Management:    Induction:   PONV Risk Score and Plan: 3 and Treatment may vary due to age or medical condition and Midazolam  Airway Management Planned: Natural Airway and Nasal Cannula  Additional Equipment: None  Intra-op Plan:   Post-operative Plan:   Informed Consent: I have reviewed the patients History and Physical, chart, labs and discussed the procedure including the risks, benefits and alternatives for the proposed anesthesia with the patient or authorized representative who has indicated his/her understanding and acceptance.     Dental advisory given and Consent reviewed with POA  Plan Discussed with: CRNA  Anesthesia Plan Comments: (L supraclavicular block)      Anesthesia Quick Evaluation

## 2021-05-07 ENCOUNTER — Ambulatory Visit (HOSPITAL_BASED_OUTPATIENT_CLINIC_OR_DEPARTMENT_OTHER): Payer: Medicare HMO | Admitting: Anesthesiology

## 2021-05-07 ENCOUNTER — Encounter (HOSPITAL_BASED_OUTPATIENT_CLINIC_OR_DEPARTMENT_OTHER): Payer: Self-pay | Admitting: Orthopedic Surgery

## 2021-05-07 ENCOUNTER — Ambulatory Visit (HOSPITAL_BASED_OUTPATIENT_CLINIC_OR_DEPARTMENT_OTHER)
Admission: RE | Admit: 2021-05-07 | Discharge: 2021-05-07 | Disposition: A | Discharge status: Home / Self Care | Payer: Medicare HMO | Attending: Orthopedic Surgery | Admitting: Orthopedic Surgery

## 2021-05-07 ENCOUNTER — Encounter (HOSPITAL_BASED_OUTPATIENT_CLINIC_OR_DEPARTMENT_OTHER)
Admission: RE | Disposition: A | Discharge status: Home / Self Care | Payer: Self-pay | Source: Home / Self Care | Attending: Orthopedic Surgery

## 2021-05-07 DIAGNOSIS — S52502A Unspecified fracture of the lower end of left radius, initial encounter for closed fracture: Secondary | ICD-10-CM | POA: Diagnosis not present

## 2021-05-07 DIAGNOSIS — Z79899 Other long term (current) drug therapy: Secondary | ICD-10-CM | POA: Insufficient documentation

## 2021-05-07 DIAGNOSIS — S62102A Fracture of unspecified carpal bone, left wrist, initial encounter for closed fracture: Secondary | ICD-10-CM

## 2021-05-07 DIAGNOSIS — Z7982 Long term (current) use of aspirin: Secondary | ICD-10-CM | POA: Diagnosis not present

## 2021-05-07 DIAGNOSIS — E7800 Pure hypercholesterolemia, unspecified: Secondary | ICD-10-CM | POA: Diagnosis not present

## 2021-05-07 DIAGNOSIS — S52592A Other fractures of lower end of left radius, initial encounter for closed fracture: Secondary | ICD-10-CM | POA: Diagnosis not present

## 2021-05-07 DIAGNOSIS — S52602A Unspecified fracture of lower end of left ulna, initial encounter for closed fracture: Secondary | ICD-10-CM | POA: Diagnosis not present

## 2021-05-07 DIAGNOSIS — X58XXXA Exposure to other specified factors, initial encounter: Secondary | ICD-10-CM | POA: Insufficient documentation

## 2021-05-07 DIAGNOSIS — I10 Essential (primary) hypertension: Secondary | ICD-10-CM | POA: Diagnosis not present

## 2021-05-07 HISTORY — DX: Atherosclerotic heart disease of native coronary artery without angina pectoris: I25.10

## 2021-05-07 HISTORY — DX: Interstitial pulmonary disease, unspecified: J84.9

## 2021-05-07 HISTORY — PX: ORIF WRIST FRACTURE: SHX2133

## 2021-05-07 LAB — POCT I-STAT, CHEM 8
BUN: 21 mg/dL (ref 8–23)
Calcium, Ion: 1.26 mmol/L (ref 1.15–1.40)
Chloride: 102 mmol/L (ref 98–111)
Creatinine, Ser: 1.2 mg/dL — ABNORMAL HIGH (ref 0.44–1.00)
Glucose, Bld: 106 mg/dL — ABNORMAL HIGH (ref 70–99)
HCT: 33 % — ABNORMAL LOW (ref 36.0–46.0)
Hemoglobin: 11.2 g/dL — ABNORMAL LOW (ref 12.0–15.0)
Potassium: 3.1 mmol/L — ABNORMAL LOW (ref 3.5–5.1)
Sodium: 139 mmol/L (ref 135–145)
TCO2: 24 mmol/L (ref 22–32)

## 2021-05-07 SURGERY — OPEN REDUCTION INTERNAL FIXATION (ORIF) WRIST FRACTURE
Anesthesia: Regional | Site: Wrist | Laterality: Left

## 2021-05-07 MED ORDER — ACETAMINOPHEN 10 MG/ML IV SOLN: 1000.0000 mg | Freq: Once | INTRAVENOUS | Status: DC | PRN

## 2021-05-07 MED ORDER — CEFAZOLIN SODIUM-DEXTROSE 2-4 GM/100ML-% IV SOLN
2.0000 g | INTRAVENOUS | Status: AC
  Administered 2021-05-07: 2 g via INTRAVENOUS

## 2021-05-07 MED ORDER — FENTANYL CITRATE (PF) 100 MCG/2ML IJ SOLN: 25.0000 ug | Freq: Once | INTRAMUSCULAR | Status: DC

## 2021-05-07 MED ORDER — EPHEDRINE SULFATE 50 MG/ML IJ SOLN
INTRAMUSCULAR | Status: DC | PRN
  Administered 2021-05-07: 10 mg via INTRAVENOUS

## 2021-05-07 MED ORDER — EPHEDRINE 5 MG/ML INJ
INTRAVENOUS | Status: AC
  Filled 2021-05-07: qty 5

## 2021-05-07 MED ORDER — ONDANSETRON HCL 4 MG/2ML IJ SOLN
INTRAMUSCULAR | Status: AC
  Filled 2021-05-07: qty 2

## 2021-05-07 MED ORDER — CEFAZOLIN SODIUM-DEXTROSE 2-4 GM/100ML-% IV SOLN
INTRAVENOUS | Status: AC
  Filled 2021-05-07: qty 100

## 2021-05-07 MED ORDER — PROPOFOL 500 MG/50ML IV EMUL
INTRAVENOUS | Status: AC
  Filled 2021-05-07: qty 50

## 2021-05-07 MED ORDER — ONDANSETRON HCL 4 MG/2ML IJ SOLN: 4.0000 mg | Freq: Once | INTRAMUSCULAR | Status: DC | PRN

## 2021-05-07 MED ORDER — LACTATED RINGERS IV SOLN
INTRAVENOUS | Status: DC
  Administered 2021-05-07: 1000 mL via INTRAVENOUS

## 2021-05-07 MED ORDER — FENTANYL CITRATE (PF) 100 MCG/2ML IJ SOLN
INTRAMUSCULAR | Status: AC
  Filled 2021-05-07: qty 2

## 2021-05-07 MED ORDER — CLONIDINE HCL (ANALGESIA) 100 MCG/ML EP SOLN
EPIDURAL | Status: DC | PRN
  Administered 2021-05-07: 100 ug

## 2021-05-07 MED ORDER — ONDANSETRON HCL 4 MG/2ML IJ SOLN
INTRAMUSCULAR | Status: DC | PRN
  Administered 2021-05-07: 4 mg via INTRAVENOUS

## 2021-05-07 MED ORDER — PROPOFOL 500 MG/50ML IV EMUL
INTRAVENOUS | Status: DC | PRN
  Administered 2021-05-07: 50 ug/kg/min via INTRAVENOUS

## 2021-05-07 MED ORDER — 0.9 % SODIUM CHLORIDE (POUR BTL) OPTIME
TOPICAL | Status: DC | PRN
  Administered 2021-05-07: 500 mL

## 2021-05-07 MED ORDER — FENTANYL CITRATE (PF) 100 MCG/2ML IJ SOLN: 25.0000 ug | INTRAMUSCULAR | Status: DC | PRN

## 2021-05-07 MED ORDER — ROPIVACAINE HCL 5 MG/ML IJ SOLN
INTRAMUSCULAR | Status: DC | PRN
  Administered 2021-05-07: 30 mL via PERINEURAL

## 2021-05-07 SURGICAL SUPPLY — 65 items
BIT DRILL 2.2 SS TIBIAL (BIT) ×1 IMPLANT
BLADE SURG 15 STRL LF DISP TIS (BLADE) ×2 IMPLANT
BLADE SURG 15 STRL SS (BLADE) ×4
BNDG CMPR 9X4 STRL LF SNTH (GAUZE/BANDAGES/DRESSINGS) ×1
BNDG ELASTIC 4X5.8 VLCR STR LF (GAUZE/BANDAGES/DRESSINGS) ×2 IMPLANT
BNDG ESMARK 4X9 LF (GAUZE/BANDAGES/DRESSINGS) ×2 IMPLANT
CLSR STERI-STRIP ANTIMIC 1/2X4 (GAUZE/BANDAGES/DRESSINGS) ×2 IMPLANT
CORD BIPOLAR FORCEPS 12FT (ELECTRODE) ×2 IMPLANT
COVER BACK TABLE 60X90IN (DRAPES) ×2 IMPLANT
CUFF TOURN SGL QUICK 18X4 (TOURNIQUET CUFF) IMPLANT
CUFF TOURN SGL QUICK 24 (TOURNIQUET CUFF)
CUFF TRNQT CYL 24X4X16.5-23 (TOURNIQUET CUFF) IMPLANT
DECANTER SPIKE VIAL GLASS SM (MISCELLANEOUS) IMPLANT
DRAPE EXTREMITY T 121X128X90 (DISPOSABLE) ×2 IMPLANT
DRAPE IMP U-DRAPE 54X76 (DRAPES) ×2 IMPLANT
DRAPE OEC MINIVIEW 54X84 (DRAPES) ×2 IMPLANT
DRAPE SHEET LG 3/4 BI-LAMINATE (DRAPES) ×2 IMPLANT
DRAPE SURG 17X23 STRL (DRAPES) IMPLANT
DVR VOLAR RIM NRW PLATE LEFT (Plate) ×2 IMPLANT
GAUZE 4X4 16PLY ~~LOC~~+RFID DBL (SPONGE) ×2 IMPLANT
GAUZE SPONGE 4X4 12PLY STRL (GAUZE/BANDAGES/DRESSINGS) ×2 IMPLANT
GAUZE XEROFORM 1X8 LF (GAUZE/BANDAGES/DRESSINGS) ×11 IMPLANT
GLOVE SURG ENC MOIS LTX SZ7.5 (GLOVE) ×2 IMPLANT
GLOVE SURG UNDER POLY LF SZ7.5 (GLOVE) ×2 IMPLANT
GOWN STRL REUS W/TWL LRG LVL3 (GOWN DISPOSABLE) ×4 IMPLANT
HIBICLENS CHG 4% 4OZ (MISCELLANEOUS) ×2 IMPLANT
K-WIRE 1.6 (WIRE) ×6
K-WIRE DBL END TROCAR 6X.045 (WIRE) ×4
K-WIRE FX5X1.6XNS BN SS (WIRE) ×3
KIT TURNOVER CYSTO (KITS) ×2 IMPLANT
KWIRE DBL END TROCAR 6X.045 (WIRE) IMPLANT
KWIRE FX5X1.6XNS BN SS (WIRE) IMPLANT
NDL HYPO 25X1 1.5 SAFETY (NEEDLE) IMPLANT
NEEDLE HYPO 25X1 1.5 SAFETY (NEEDLE) IMPLANT
NS IRRIG 1000ML POUR BTL (IV SOLUTION) ×2 IMPLANT
PACK BASIN DAY SURGERY FS (CUSTOM PROCEDURE TRAY) ×2 IMPLANT
PAD CAST 4YDX4 CTTN HI CHSV (CAST SUPPLIES) ×1 IMPLANT
PADDING CAST ABS 4INX4YD NS (CAST SUPPLIES) ×1
PADDING CAST ABS COTTON 4X4 ST (CAST SUPPLIES) ×1 IMPLANT
PADDING CAST COTTON 4X4 STRL (CAST SUPPLIES) ×2
PLATE VOLAR RIM NRRW DVR LEFT (Plate) IMPLANT
SCREW LOCK 14X2.7X 3 LD TPR (Screw) IMPLANT
SCREW LOCK 16X2.7X 3 LD TPR (Screw) IMPLANT
SCREW LOCK 18X2.7X 3 LD TPR (Screw) IMPLANT
SCREW LOCKING 2.7X13MM (Screw) ×1 IMPLANT
SCREW LOCKING 2.7X14 (Screw) ×8 IMPLANT
SCREW LOCKING 2.7X15MM (Screw) ×3 IMPLANT
SCREW LOCKING 2.7X16 (Screw) ×2 IMPLANT
SCREW LOCKING 2.7X18 (Screw) ×2 IMPLANT
SCREW MULTI DIRECTIONAL 2.7X14 (Screw) ×1 IMPLANT
SCREW MULTI DIRECTIONAL 2.7X20 (Screw) ×1 IMPLANT
SLEEVE SCD COMPRESS KNEE MED (STOCKING) IMPLANT
SLING ARM FOAM STRAP MED (SOFTGOODS) ×1 IMPLANT
SPLINT PLASTER CAST XFAST 3X15 (CAST SUPPLIES) IMPLANT
SPLINT PLASTER CAST XFAST 4X15 (CAST SUPPLIES) IMPLANT
SPLINT PLASTER XTRA FAST SET 4 (CAST SUPPLIES) ×1
SPLINT PLASTER XTRA FASTSET 3X (CAST SUPPLIES)
SUCTION FRAZIER HANDLE 10FR (MISCELLANEOUS)
SUCTION TUBE FRAZIER 10FR DISP (MISCELLANEOUS) IMPLANT
SUT ETHILON 4 0 PS 2 18 (SUTURE) ×2 IMPLANT
SYR BULB EAR ULCER 3OZ GRN STR (SYRINGE) ×2 IMPLANT
SYR CONTROL 10ML LL (SYRINGE) IMPLANT
TOWEL OR 17X26 10 PK STRL BLUE (TOWEL DISPOSABLE) ×2 IMPLANT
TUBE CONNECTING 12X1/4 (SUCTIONS) IMPLANT
UNDERPAD 30X36 HEAVY ABSORB (UNDERPADS AND DIAPERS) ×2 IMPLANT

## 2021-05-07 NOTE — Anesthesia Procedure Notes (Addendum)
Anesthesia Regional Block: Supraclavicular block   Pre-Anesthetic Checklist: , timeout performed,  Correct Patient, Correct Site, Correct Laterality,  Correct Procedure, Correct Position, site marked,  Risks and benefits discussed,  Surgical consent,  Pre-op evaluation,  At surgeon's request and post-op pain management  Laterality: Left and Upper  Prep: chloraprep       Needles:  Injection technique: Single-shot  Needle Type: Echogenic Needle     Needle Length: 5cm  Needle Gauge: 21     Additional Needles:   Procedures:,,,, ultrasound used (permanent image in chart),,    Narrative:  Start time: 05/07/2021 10:17 AM End time: 05/07/2021 10:24 AM Injection made incrementally with aspirations every 5 mL.  Performed by: Personally  Anesthesiologist: Barnet Glasgow, MD

## 2021-05-07 NOTE — Anesthesia Postprocedure Evaluation (Signed)
Anesthesia Post Note  Patient: Bianca [name]  Procedure(s) Performed: OPEN REDUCTION INTERNAL FIXATION (ORIF) WRIST FRACTURE (Left: Wrist)     Patient location during evaluation: PACU Anesthesia Type: Regional Level of consciousness: awake and alert Pain management: pain level controlled Vital Signs Assessment: post-procedure vital signs reviewed and stable Respiratory status: spontaneous breathing, nonlabored ventilation, respiratory function stable and patient connected to nasal cannula oxygen Cardiovascular status: stable and blood pressure returned to baseline Postop Assessment: no apparent nausea or vomiting Anesthetic complications: no   No notable events documented.  Last Vitals:  Vitals:   05/07/21 1345 05/07/21 1406  BP: 132/69 135/80  Pulse: 77 66  Resp: 19 18  Temp:  36.6 C  SpO2: 96% 93%    Last Pain:  Vitals:   05/07/21 1406  TempSrc:   PainSc: 0-No pain                 Barnet Glasgow

## 2021-05-07 NOTE — Anesthesia Procedure Notes (Signed)
Procedure Name: MAC Date/Time: 05/07/2021 11:25 AM Performed by: Justice Rocher, CRNA Pre-anesthesia Checklist: Timeout performed, Patient being monitored, Suction available, Emergency Drugs available and Patient identified Patient Re-evaluated:Patient Re-evaluated prior to induction Oxygen Delivery Method: Simple face mask Preoxygenation: Pre-oxygenation with 100% oxygen Induction Type: IV induction Placement Confirmation: positive ETCO2, CO2 detector and breath sounds checked- equal and bilateral

## 2021-05-07 NOTE — Op Note (Signed)
OPERATIVE NOTE  DATE OF PROCEDURE: 05/07/2021  SURGEONS: Primary: Orene Desanctis, MD  PREOPERATIVE DIAGNOSIS: left distal radius and ulna fracture  POSTOPERATIVE DIAGNOSIS: Same  NAME OF PROCEDURE:    Left distal radius open reduction internal fixation, >3 fragments 2.    Left wrist brachioradialis tenotomy  3.    Left wrist radiographs four views with intraoperative interpretation  ANESTHESIA: Regional Block + MAC  SKIN PREPARATION: Hibiclens  ESTIMATED BLOOD LOSS: Minimal  IMPLANTS: [employer] DVR Crosslock volar plate and screws  Implant Name Type Inv. Item Serial No. Manufacturer Lot No. LRB No. Used Action  DVR volar rim plate left      Left 1 Implanted  SCREW LOCKING 2.7X14 - [id number] Screw SCREW LOCKING 2.7X14  [employer](ORTH,TRAU,BIO,SG)  Left 2 Implanted  SCREW LOCKING 2.7X15MM - [id number] Screw SCREW LOCKING 2.7X15MM  [employer](ORTH,TRAU,BIO,SG)  Left 3 Implanted  SCREW LOCKING 2.7X16 - [id number] Screw SCREW LOCKING 2.7X16  [employer](ORTH,TRAU,BIO,SG)  Left 1 Implanted  SCREW LOCKING 2.7X18 - [id number] Screw SCREW LOCKING 2.7X18  [employer](ORTH,TRAU,BIO,SG)  Left 1 Implanted  SCREW MULTI DIRECTIONAL 2.7X14 - [id number] Screw SCREW MULTI DIRECTIONAL 2.7X14  [employer](ORTH,TRAU,BIO,SG)  Left 1 Implanted  2.108m multi directional screw      Left 1 Implanted  SCREW LOCKING 2.7X13MM - [id number] SCREW LOCKING 2.7X13MM  [employer](ORTH,TRAU,BIO,SG)  Left 1 Implanted    INDICATIONS:  JKamerenis a 75y.o. female who has the above preoperative diagnosis. The patient has decided to proceed with surgical intervention.  Risks, benefits and alternatives of operative management were discussed including, but not limited to, risks of anesthesia complications, infection, pain, persistent symptoms, stiffness, need for future surgery.  The patient understands, agrees and elects to proceed with surgery.    DESCRIPTION OF PROCEDURE: The patient was met in the pre-operative area  and their identity was verified.  The operative location and laterality was also verified and marked.  The patient was brought to the OR and was placed supine on the table.  After repeat patient identification with the operative team anesthesia was provided and the patient was prepped and draped in the usual sterile fashion.  A final timeout was performed verifying the correction patient, procedure, location and laterality.  Preoperative antibiotics were administered. The left upper extremity was exsanguinated with an Esmarch and tourniquet inflated to 2568mg. Under loupe magnification, an incision was made directly over the flexor carpi radialis (FCR) tendon. Bipolar was utilized for hemostasis. The roof of the FCR tendon sheath was incised. The FCR tendon was then retracted ulnarly to protect the palmar cutaneous branch of the median nerve. Subsequently, the floor of the FCR tendon sheath was incised over the distal end of the radius.  The flexor pollicis longus (FPL) was swept ulnarly to reveal the pronator quadratus.  The pronator quadratus fascia was incised from its distal and radial borders. A periosteal elevator was utilized to mobilize the pronator quadratus muscle off the distal radius.  The fracture site was irrigated and prepared for reduction with a freer and adson forceps, there were multiple fracture fragments greater than 3 identified and reduced. The brachioradialis tendon insertion was released to facilitate reduction.  This release was performed by identifying the broad insertion of the brachiradialis tendon and also identifying the 1st dorsal compartment tendons.  The 1st dorsal compartment tendons were protected, the broad tendon insertion was released under direct visualization. The fracture was reduced and provisionally fixed with K-wires. We then selected a proper length and width volar plate.  The plate was placed on the distal end of the radius with the fracture reduced and secured to  the bone. Using mini C-arm the fracture reduction and position of the plate were deemed to be satisfactory.  We proceeded with securing the plate to the radius with the 1 bicortical nonlocking screw in the oblong portion of the shaft.  With the intermediate column reduced, we secured the distal end of the plate with 2 screws in the distal ulnar portion of plate.  We again used the C-arm to verify satisfactory plate position as well as fracture reduction. The radial column was then reduced and radial styloid locking screws were placed. The remainder shaft screws were placed through the plate. We used the mini C-arm to verify satisfactory plate position, screw lengths and fracture reduction. The DRUJ was then tested in neutral, pronation and supination and was found to be stable.The tourniquet was deflated. Meticulous hemostasis was obtained. The incision was copiously irrigated with normal saline and closed with interrupted 4-0 nylon horizontal mattress sutures. The incision was covered with xeroform, sterile guaze, webril and well padded short arm splint. The fingers were pink, warm and well perfused. All counts were correct. The patient was awoken from anesthesia and brought to PACU for recovery in stable condition.   Matt Holmes, MD Orthopaedic Hand Surgery

## 2021-05-07 NOTE — Progress Notes (Signed)
Assisted Dr. Houser with left, ultrasound guided, supraclavicular block. Side rails up, monitors on throughout procedure. See vital signs in flow sheet. Tolerated Procedure well. °

## 2021-05-07 NOTE — Discharge Instructions (Addendum)
Orthopaedic Hand Surgery Discharge Instructions  WEIGHT BEARING STATUS: Non weight bearing on operative extremity  DRESSINGS: Please keep your dressing/splint/cast clean and dry until your follow-up appointment. You may shower by placing a waterproof covering over your dressing/splint/cast. Contact your surgeon if your splint/cast gets wet. It will need to be changed to prevent skin breakdown.  PAIN CONTROL: First line medications for post operative pain control are Tylenol (acetaminophen) and Motrin (ibuprofen) if you are able to take these medications. If you have been prescribed a medication these can be taken as breakthrough pain medications. Please note that some narcotic pain medication have acetaminophen added and you should never consume more than 4,000mg  of acetaminophen in 24 hour period. Also please note that if you are given Toradol (ketoralac) you should not take similar medications simultaneously such as ibuprofen.   ICE/ELEVATION: Ice and elevate your injured extremity as needed. Avoid direct contact of ice with skin.  HOME MEDICATIONS: No changes have been made to your home medications.  FOLLOW UP: You will be called after surgery with an appointment date and time, however if you have not received a phone call within 3 days please call during regular office hours at [phone] to schedule a post operative appointment.  Please Seek Medical Attention if: Call MD for: pain or pressure in chest, jaw, arm, back, neck  Call MD for: temperature greater than 101 F for more than 24 hours  Call MD for: difficulty breathing Call MD for: Incision redness, bleeding, drainage  Call MD for: palpitations or feeling that the heart is racing  Call MD for: increased swelling in arm, leg, ankle, or abdomen  Call MD for: lightheadedness, dizziness, fainting Go to ED or call 911 if: chest pain does not go away after 3 nitroglycerin doses taken 5 min apart  Go to ED or call 911 for: any  uncontrolled bleeding  Go to ED or call 911 if: unable to reach physician  Discharge Medications: Percocet 5/325mg  #20 sent to pharmacy through [facility] for breakthrough pain. Allergies as of 05/07/2021   No Known Allergies      Medication List     STOP taking these medications    HYDROcodone-acetaminophen 5-325 MG tablet Commonly known as: NORCO/VICODIN       TAKE these medications    aspirin 81 MG tablet Take 81 mg by mouth daily.   fexofenadine 180 MG tablet Commonly known as: ALLEGRA Take by mouth as needed. allergies   metoprolol succinate 50 MG 24 hr tablet Commonly known as: TOPROL-XL daily.   ondansetron 4 MG disintegrating tablet Commonly known as: Zofran ODT Take 1 tablet (4 mg total) by mouth every 8 (eight) hours as needed for nausea or vomiting.   OVER THE COUNTER MEDICATION Vitamin d 3 2 tabs daily   POTASSIUM BICARBONATE PO Take 550 mg by mouth daily.   pyridoxine 100 MG tablet Commonly known as: B-6 Take 100 mg by mouth daily.   rosuvastatin 20 MG tablet Commonly known as: CRESTOR Take 20 mg by mouth at bedtime.   telmisartan 80 MG tablet Commonly known as: MICARDIS Take 80 mg by mouth daily.   VITAMIN B 12 PO Take 1,000 mcg by mouth daily.   vitamin C 500 MG tablet Commonly known as: ASCORBIC ACID Take 500 mg by mouth daily.   VITAMIN D PO Take 1,000 Units by mouth daily.          Matt Holmes, MD Orthopaedic Hand Surgery     Post Anesthesia  Home Care Instructions  Activity: Get plenty of rest for the remainder of the day. A responsible individual must stay with you for 24 hours following the procedure.  For the next 24 hours, DO NOT: -Drive a car -Paediatric nurse -Drink alcoholic beverages -Take any medication unless instructed by your physician -Make any legal decisions or sign important papers.  Meals: Start with liquid foods such as gelatin or soup. Progress to regular foods as tolerated. Avoid  greasy, spicy, heavy foods. If nausea and/or vomiting occur, drink only clear liquids until the nausea and/or vomiting subsides. Call your physician if vomiting continues.  Special Instructions/Symptoms: Your throat may feel dry or sore from the anesthesia or the breathing tube placed in your throat during surgery. If this causes discomfort, gargle with warm salt water. The discomfort should disappear within 24 hours.  Regional Anesthesia Blocks  1. Numbness or the inability to move the "blocked" extremity may last from 3-48 hours after placement. The length of time depends on the medication injected and your individual response to the medication. If the numbness is not going away after 48 hours, call your surgeon.  2. The extremity that is blocked will need to be protected until the numbness is gone and the  Strength has returned. Because you cannot feel it, you will need to take extra care to avoid injury. Because it may be weak, you may have difficulty moving it or using it. You may not know what position it is in without looking at it while the block is in effect.  3. For blocks in the legs and feet, returning to weight bearing and walking needs to be done carefully. You will need to wait until the numbness is entirely gone and the strength has returned. You should be able to move your leg and foot normally before you try and bear weight or walk. You will need someone to be with you when you first try to ensure you do not fall and possibly risk injury.  4. Bruising and tenderness at the needle site are common side effects and will resolve in a few days.  5. Persistent numbness or new problems with movement should be communicated to the surgeon or the [facility] [phone] [place] [phone]).

## 2021-05-07 NOTE — Transfer of Care (Addendum)
Immediate Anesthesia Transfer of Care Note  Patient: Bianca [name]  Procedure(s) Performed: Procedure(s) (LRB): OPEN REDUCTION INTERNAL FIXATION (ORIF) WRIST FRACTURE (Left)  Patient Location: PACU  Anesthesia Type: MAC  Level of Consciousness: awake, sedated, patient cooperative and responds to stimulation  Airway & Oxygen Therapy: Patient Spontanous Breathing and Patient on RA with soft FM   Post-op Assessment: Report given to PACU RN, Post -op Vital signs reviewed and stable and Patient moving all extremities  Post vital signs: Reviewed and stable  Complications: No apparent anesthesia complications

## 2021-05-07 NOTE — Interval H&P Note (Signed)
History and Physical Interval Note:  05/07/2021 10:14 AM  Bianca [name]  has presented today for surgery, with the diagnosis of left distal radius and ulna fracture.  The various methods of treatment have been discussed with the patient and family. After consideration of risks, benefits and other options for treatment, the patient has consented to  Procedure(s) with comments: OPEN REDUCTION INTERNAL FIXATION (ORIF) WRIST FRACTURE (Left) - with MAC as a surgical intervention.  The patient's history has been reviewed, patient examined, no change in status, stable for surgery.  I have reviewed the patient's chart and labs.  Questions were answered to the patient's satisfaction.     Orene Desanctis

## 2021-05-08 ENCOUNTER — Encounter (HOSPITAL_BASED_OUTPATIENT_CLINIC_OR_DEPARTMENT_OTHER): Payer: Self-pay | Admitting: Orthopedic Surgery

## 2021-05-08 DIAGNOSIS — S52502A Unspecified fracture of the lower end of left radius, initial encounter for closed fracture: Secondary | ICD-10-CM | POA: Diagnosis not present

## 2021-05-20 DIAGNOSIS — Z8249 Family history of ischemic heart disease and other diseases of the circulatory system: Secondary | ICD-10-CM | POA: Diagnosis not present

## 2021-05-20 DIAGNOSIS — R69 Illness, unspecified: Secondary | ICD-10-CM | POA: Diagnosis not present

## 2021-05-20 DIAGNOSIS — E785 Hyperlipidemia, unspecified: Secondary | ICD-10-CM | POA: Diagnosis not present

## 2021-05-20 DIAGNOSIS — Z87891 Personal history of nicotine dependence: Secondary | ICD-10-CM | POA: Diagnosis not present

## 2021-05-20 DIAGNOSIS — Z809 Family history of malignant neoplasm, unspecified: Secondary | ICD-10-CM | POA: Diagnosis not present

## 2021-05-20 DIAGNOSIS — G3184 Mild cognitive impairment, so stated: Secondary | ICD-10-CM | POA: Diagnosis not present

## 2021-05-20 DIAGNOSIS — I10 Essential (primary) hypertension: Secondary | ICD-10-CM | POA: Diagnosis not present

## 2021-05-20 DIAGNOSIS — M1990 Unspecified osteoarthritis, unspecified site: Secondary | ICD-10-CM | POA: Diagnosis not present

## 2021-05-27 DIAGNOSIS — S52502A Unspecified fracture of the lower end of left radius, initial encounter for closed fracture: Secondary | ICD-10-CM | POA: Diagnosis not present

## 2021-06-02 ENCOUNTER — Other Ambulatory Visit: Payer: Self-pay

## 2021-06-02 ENCOUNTER — Encounter (HOSPITAL_BASED_OUTPATIENT_CLINIC_OR_DEPARTMENT_OTHER): Payer: Self-pay | Admitting: Orthopedic Surgery

## 2021-06-02 NOTE — Progress Notes (Addendum)
Spoke w/ via phone for pre-op interview---pt and sister in law peggy smith per patient request Lab needs dos----  I stat ekg 05-07-2021 on chart/epic          Lab results------see below COVID test -----patient  siste rin law peggy smith states asymptomatic no test needed Arrive at -------pt arriving at 77 am   06-04-2021 due to pt spouse dropping her off and going to 1145 am radiation treatment NPO after MN NO Solid Food. Water  from [place] until---930 am  Med rec completed Medications to take morning of surgery -----metoprolol succinate  Diabetic medication -----n/a Patient instructed no nail polish to be worn day of surgery Patient instructed to bring photo id and insurance card day of surgery Patient aware to have Driver (ride ) / caregiver  driver pebby smith sister in law cell [phone] spouse archie caregiver   for 24 hours after surgery  Patient Special Instructions -----none Pre-Op special Istructions -----none Patient verbalized understanding of instructions that were given at this phone interview. Patient denies shortness of breath, chest pain, fever, cough at this phone interview.   Spoke with michael foster mda and reviewed the following records; lov cardiology 11-21-2019 epic, [facility] 10-21-2017 epic, chest ct 04-25-2018 epic, and pt medical history pt ok for surgery 05-07-2021 at [facility] per dr Legrand Como foster mda.  Patient with memory issues signs own consents driver peggy smith sister in law to help with medications dos and spouse archie available by phone if needed cell [phone] had recent tongue surgery for cancer and cannot speak well)

## 2021-06-03 NOTE — Progress Notes (Signed)
Spoke with patient informed to come in at 1045 for tomorrows procedure and told to stop liquids at 0900

## 2021-06-03 NOTE — H&P (Signed)
Preoperative History & Physical Exam  Surgeon: Matt Holmes, MD  Diagnosis: left wrist fracture status post open reduction internal fixation  Planned Procedure: Procedure(s) (LRB): HARDWARE REMOVAL (Left) wrist  History of Present Illness:   Patient is a 75 y.o. female with symptoms consistent with  left wrist fracture status post open reduction internal fixation who presents for surgical intervention. The risks, benefits and alternatives of surgical intervention were discussed and informed consent was obtained prior to surgery.  Past Medical History:  Past Medical History:  Diagnosis Date   Arthritis    Coronary artery disease    Elevated cholesterol    Hypertension    ILD (interstitial lung disease) ([place])    mild ild per lov dr Elsworth Soho pumonary 10-21-2017   Left wrist fracture 05/04/2021   in sling   Memory problem 05/06/2021   no diagnosis signs owns consent sister in law peggy Tamala Julian helps due to spouse recent tonue surgery no formal poa   Poor historian 05/06/2021   no diagnosis but sister in law peggy Tamala Julian helps due to spouse recent tongue surgery    Past Surgical History:  Past Surgical History:  Procedure Laterality Date   ABDOMINAL SURGERY  1988   Exp. Laparotomy -Micro tuboplasty   ANTERIOR CRUCIATE LIGAMENT REPAIR     COSMETIC SURGERY     hemorrdectomy     NASAL SINUS SURGERY     OOPHORECTOMY  2009   BSO cystadenofibroma   ORIF WRIST FRACTURE Left 05/07/2021   Procedure: OPEN REDUCTION INTERNAL FIXATION (ORIF) WRIST FRACTURE;  Surgeon: Orene Desanctis, MD;  Location: [facility];  Service: Orthopedics;  Laterality: Left;  with MAC   PELVIC LAPAROSCOPY     DL with tubal lavage    Medications:  Prior to Admission medications   Medication Sig Start Date End Date Taking? Authorizing Provider  aspirin 81 MG tablet Take 81 mg by mouth daily.    [provider]  Cholecalciferol (VITAMIN D PO) Take 1,000 Units by mouth daily.     [provider]  Cyanocobalamin (VITAMIN B 12 PO) Take 1,000 mcg by mouth daily.     [provider]  fexofenadine (ALLEGRA) 180 MG tablet Take by mouth as needed. allergies    [provider]  metoprolol succinate (TOPROL-XL) 50 MG 24 hr tablet daily. 08/07/15   [provider]  ondansetron (ZOFRAN ODT) 4 MG disintegrating tablet Take 1 tablet (4 mg total) by mouth every 8 (eight) hours as needed for nausea or vomiting. 05/04/21   Regan Lemming, MD  OVER THE COUNTER MEDICATION Vitamin d 3 2 tabs daily    [provider]  POTASSIUM BICARBONATE PO Take 550 mg by mouth daily.    [provider]  pyridoxine (B-6) 100 MG tablet Take 100 mg by mouth daily.    [provider]  rosuvastatin (CRESTOR) 20 MG tablet Take 20 mg by mouth at bedtime.    [provider]  telmisartan (MICARDIS) 80 MG tablet Take 80 mg by mouth daily. 08/07/15   [provider]  vitamin C (ASCORBIC ACID) 500 MG tablet Take 500 mg by mouth daily.    [provider]    Allergies:  Patient has no known allergies.  Review of Systems: Negative except per HPI.  Physical Exam: Alert and oriented, NAD Head and neck: no masses, normal alignment CV: pulse intact Pulm: no increased work of breathing, respirations even and unlabored Abdomen: non-distended Extremities: extremities warm and well perfused  LABS: Recent Results (from the past 2160 hour(s))  I-STAT, chem 8     Status: Abnormal   Collection Time: 05/07/21  9:59 AM  Result Value Ref Range   Sodium 139 135 - 145 mmol/L   Potassium 3.1 (L) 3.5 - 5.1 mmol/L   Chloride 102 98 - 111 mmol/L   BUN 21 8 - 23 mg/dL   Creatinine, Ser 1.20 (H) 0.44 - 1.00 mg/dL   Glucose, Bld 106 (H) 70 - 99 mg/dL    Comment: Glucose reference range applies only to samples taken after fasting for at least 8 hours.   Calcium, Ion 1.26 1.15 - 1.40 mmol/L   TCO2 24 22 - 32 mmol/L   Hemoglobin 11.2 (L) 12.0 - 15.0  g/dL   HCT 33.0 (L) 36.0 - 46.0 %     Complete History and Physical exam available in the office notes  Orene Desanctis

## 2021-06-04 ENCOUNTER — Encounter (HOSPITAL_BASED_OUTPATIENT_CLINIC_OR_DEPARTMENT_OTHER)
Admission: RE | Disposition: A | Discharge status: Home / Self Care | Payer: Self-pay | Source: Home / Self Care | Attending: Orthopedic Surgery

## 2021-06-04 ENCOUNTER — Encounter (HOSPITAL_BASED_OUTPATIENT_CLINIC_OR_DEPARTMENT_OTHER): Payer: Self-pay | Admitting: Orthopedic Surgery

## 2021-06-04 ENCOUNTER — Ambulatory Visit (HOSPITAL_BASED_OUTPATIENT_CLINIC_OR_DEPARTMENT_OTHER): Payer: Medicare HMO | Admitting: Anesthesiology

## 2021-06-04 ENCOUNTER — Ambulatory Visit (HOSPITAL_BASED_OUTPATIENT_CLINIC_OR_DEPARTMENT_OTHER)
Admission: RE | Admit: 2021-06-04 | Discharge: 2021-06-04 | Disposition: A | Discharge status: Home / Self Care | Payer: Medicare HMO | Attending: Orthopedic Surgery | Admitting: Orthopedic Surgery

## 2021-06-04 ENCOUNTER — Other Ambulatory Visit: Payer: Self-pay

## 2021-06-04 DIAGNOSIS — Z87891 Personal history of nicotine dependence: Secondary | ICD-10-CM | POA: Insufficient documentation

## 2021-06-04 DIAGNOSIS — I10 Essential (primary) hypertension: Secondary | ICD-10-CM | POA: Diagnosis not present

## 2021-06-04 DIAGNOSIS — S62102A Fracture of unspecified carpal bone, left wrist, initial encounter for closed fracture: Secondary | ICD-10-CM

## 2021-06-04 DIAGNOSIS — Z472 Encounter for removal of internal fixation device: Secondary | ICD-10-CM | POA: Diagnosis not present

## 2021-06-04 DIAGNOSIS — T84398A Other mechanical complication of other bone devices, implants and grafts, initial encounter: Secondary | ICD-10-CM | POA: Diagnosis not present

## 2021-06-04 DIAGNOSIS — I2510 Atherosclerotic heart disease of native coronary artery without angina pectoris: Secondary | ICD-10-CM | POA: Diagnosis not present

## 2021-06-04 DIAGNOSIS — Z4589 Encounter for adjustment and management of other implanted devices: Secondary | ICD-10-CM | POA: Diagnosis not present

## 2021-06-04 DIAGNOSIS — E782 Mixed hyperlipidemia: Secondary | ICD-10-CM | POA: Diagnosis not present

## 2021-06-04 HISTORY — PX: HARDWARE REMOVAL: SHX979

## 2021-06-04 LAB — POCT I-STAT, CHEM 8
BUN: 16 mg/dL (ref 8–23)
Calcium, Ion: 1.15 mmol/L (ref 1.15–1.40)
Chloride: 102 mmol/L (ref 98–111)
Creatinine, Ser: 0.8 mg/dL (ref 0.44–1.00)
Glucose, Bld: 103 mg/dL — ABNORMAL HIGH (ref 70–99)
HCT: 33 % — ABNORMAL LOW (ref 36.0–46.0)
Hemoglobin: 11.2 g/dL — ABNORMAL LOW (ref 12.0–15.0)
Potassium: 3 mmol/L — ABNORMAL LOW (ref 3.5–5.1)
Sodium: 142 mmol/L (ref 135–145)
TCO2: 25 mmol/L (ref 22–32)

## 2021-06-04 SURGERY — REMOVAL, HARDWARE
Anesthesia: Regional | Site: Wrist | Laterality: Left

## 2021-06-04 MED ORDER — FENTANYL CITRATE (PF) 100 MCG/2ML IJ SOLN
50.0000 ug | Freq: Once | INTRAMUSCULAR | Status: AC
  Administered 2021-06-04: 50 ug via INTRAVENOUS

## 2021-06-04 MED ORDER — PROPOFOL 500 MG/50ML IV EMUL
INTRAVENOUS | Status: DC | PRN
  Administered 2021-06-04: 5 ug/kg/min via INTRAVENOUS

## 2021-06-04 MED ORDER — LACTATED RINGERS IV SOLN: INTRAVENOUS | Status: DC

## 2021-06-04 MED ORDER — FENTANYL CITRATE (PF) 100 MCG/2ML IJ SOLN
INTRAMUSCULAR | Status: AC
  Filled 2021-06-04: qty 2

## 2021-06-04 MED ORDER — FENTANYL CITRATE (PF) 100 MCG/2ML IJ SOLN: 25.0000 ug | INTRAMUSCULAR | Status: DC | PRN

## 2021-06-04 MED ORDER — CEFAZOLIN SODIUM-DEXTROSE 2-4 GM/100ML-% IV SOLN
INTRAVENOUS | Status: AC
  Filled 2021-06-04: qty 100

## 2021-06-04 MED ORDER — CELECOXIB 200 MG PO CAPS
200.0000 mg | ORAL_CAPSULE | Freq: Once | ORAL | Status: AC
  Administered 2021-06-04: 200 mg via ORAL

## 2021-06-04 MED ORDER — CELECOXIB 200 MG PO CAPS
ORAL_CAPSULE | ORAL | Status: AC
  Filled 2021-06-04: qty 1

## 2021-06-04 MED ORDER — ACETAMINOPHEN 500 MG PO TABS
ORAL_TABLET | ORAL | Status: AC
  Filled 2021-06-04: qty 2

## 2021-06-04 MED ORDER — 0.9 % SODIUM CHLORIDE (POUR BTL) OPTIME
TOPICAL | Status: DC | PRN
  Administered 2021-06-04: 500 mL

## 2021-06-04 MED ORDER — ACETAMINOPHEN 500 MG PO TABS
1000.0000 mg | ORAL_TABLET | Freq: Once | ORAL | Status: AC
  Administered 2021-06-04: 1000 mg via ORAL

## 2021-06-04 MED ORDER — ROPIVACAINE HCL 5 MG/ML IJ SOLN
INTRAMUSCULAR | Status: DC | PRN
  Administered 2021-06-04: 35 mL via PERINEURAL

## 2021-06-04 MED ORDER — MIDAZOLAM HCL 2 MG/2ML IJ SOLN
INTRAMUSCULAR | Status: AC
  Filled 2021-06-04: qty 2

## 2021-06-04 MED ORDER — CEFAZOLIN SODIUM-DEXTROSE 2-4 GM/100ML-% IV SOLN
2.0000 g | INTRAVENOUS | Status: AC
  Administered 2021-06-04: 2 g via INTRAVENOUS

## 2021-06-04 MED ORDER — LIDOCAINE 2% (20 MG/ML) 5 ML SYRINGE
INTRAMUSCULAR | Status: DC | PRN
  Administered 2021-06-04: 80 mg via INTRAVENOUS

## 2021-06-04 MED ORDER — DEXAMETHASONE SODIUM PHOSPHATE 4 MG/ML IJ SOLN
INTRAMUSCULAR | Status: DC | PRN
  Administered 2021-06-04: 5 mg via PERINEURAL

## 2021-06-04 SURGICAL SUPPLY — 38 items
BLADE SURG 15 STRL LF DISP TIS (BLADE) ×2 IMPLANT
BLADE SURG 15 STRL SS (BLADE) ×2
BNDG CMPR 9X4 STRL LF SNTH (GAUZE/BANDAGES/DRESSINGS) ×1
BNDG ELASTIC 4X5.8 VLCR STR LF (GAUZE/BANDAGES/DRESSINGS) ×2 IMPLANT
BNDG ESMARK 4X9 LF (GAUZE/BANDAGES/DRESSINGS) ×2 IMPLANT
BNDG PLASTER X FAST 3X3 WHT LF (CAST SUPPLIES) ×1 IMPLANT
BNDG PLSTR 9X3 FST ST WHT (CAST SUPPLIES) ×1
CORD BIPOLAR FORCEPS 12FT (ELECTRODE) ×2 IMPLANT
COVER BACK TABLE 60X90IN (DRAPES) ×2 IMPLANT
CUFF TOURN SGL QUICK 18X4 (TOURNIQUET CUFF) ×1 IMPLANT
DRAPE EXTREMITY T 121X128X90 (DISPOSABLE) ×2 IMPLANT
DRAPE OEC MINIVIEW 54X84 (DRAPES) ×2 IMPLANT
DRAPE SHEET LG 3/4 BI-LAMINATE (DRAPES) ×2 IMPLANT
DRAPE SURG 17X23 STRL (DRAPES) ×1 IMPLANT
GAUZE 4X4 16PLY ~~LOC~~+RFID DBL (SPONGE) ×2 IMPLANT
GAUZE SPONGE 4X4 12PLY STRL (GAUZE/BANDAGES/DRESSINGS) ×2 IMPLANT
GAUZE XEROFORM 1X8 LF (GAUZE/BANDAGES/DRESSINGS) ×2 IMPLANT
GLOVE SURG ENC MOIS LTX SZ7.5 (GLOVE) ×2 IMPLANT
GLOVE SURG UNDER POLY LF SZ7 (GLOVE) ×1 IMPLANT
GLOVE SURG UNDER POLY LF SZ7.5 (GLOVE) ×3 IMPLANT
GOWN STRL REUS W/TWL LRG LVL3 (GOWN DISPOSABLE) ×4 IMPLANT
HIBICLENS CHG 4% 4OZ (MISCELLANEOUS) ×2 IMPLANT
KIT TURNOVER CYSTO (KITS) ×2 IMPLANT
NS IRRIG 500ML POUR BTL (IV SOLUTION) ×1 IMPLANT
PACK BASIN DAY SURGERY FS (CUSTOM PROCEDURE TRAY) ×2 IMPLANT
PAD CAST 4YDX4 CTTN HI CHSV (CAST SUPPLIES) ×1 IMPLANT
PADDING CAST ABS 4INX4YD NS (CAST SUPPLIES) ×1
PADDING CAST ABS COTTON 4X4 ST (CAST SUPPLIES) ×1 IMPLANT
PADDING CAST COTTON 4X4 STRL (CAST SUPPLIES) ×4
SLING ARM FOAM STRAP MED (SOFTGOODS) ×1 IMPLANT
SPLINT PLASTER CAST XFAST 3X15 (CAST SUPPLIES) IMPLANT
SPLINT PLASTER XTRA FASTSET 3X (CAST SUPPLIES)
STRIP CLOSURE SKIN 1/2X4 (GAUZE/BANDAGES/DRESSINGS) ×2 IMPLANT
SUT ETHILON 4 0 PS 2 18 (SUTURE) ×2 IMPLANT
SYR BULB EAR ULCER 3OZ GRN STR (SYRINGE) ×2 IMPLANT
TOWEL OR 17X26 10 PK STRL BLUE (TOWEL DISPOSABLE) ×2 IMPLANT
TRAY DSU PREP LF (CUSTOM PROCEDURE TRAY) ×2 IMPLANT
UNDERPAD 30X36 HEAVY ABSORB (UNDERPADS AND DIAPERS) ×2 IMPLANT

## 2021-06-04 NOTE — Anesthesia Postprocedure Evaluation (Signed)
Anesthesia Post Note  Patient: Bianca [name]  Procedure(s) Performed: HARDWARE REMOVAL (Left: Wrist)     Patient location during evaluation: PACU Anesthesia Type: MAC and Regional Level of consciousness: awake and alert and oriented Pain management: pain level controlled Vital Signs Assessment: post-procedure vital signs reviewed and stable Respiratory status: spontaneous breathing, nonlabored ventilation, respiratory function stable and patient connected to nasal cannula oxygen Cardiovascular status: blood pressure returned to baseline and stable Postop Assessment: no apparent nausea or vomiting Anesthetic complications: no   No notable events documented.  Last Vitals:  Vitals:   06/04/21 1245 06/04/21 1420  BP: (!) 188/83 (!) 149/96  Pulse: (!) 57 70  Resp: 12 20  Temp:  36.5 C  SpO2: 100% 99%    Last Pain:  Vitals:   06/04/21 1131  TempSrc: Oral  PainSc: 0-No pain                 [name] A.

## 2021-06-04 NOTE — Anesthesia Procedure Notes (Signed)
Anesthesia Regional Block: Axillary brachial plexus block   Pre-Anesthetic Checklist: , timeout performed,  Correct Patient, Correct Site, Correct Laterality,  Correct Procedure, Correct Position, site marked,  Risks and benefits discussed,  Surgical consent,  Pre-op evaluation,  At surgeon's request and post-op pain management  Laterality: Upper and Left  Prep: chloraprep       Needles:  Injection technique: Single-shot  Needle Type: Stimiplex          Additional Needles:   Procedures:,,,, ultrasound used (permanent image in chart),,    Narrative:  Start time: 06/04/2021 11:38 AM End time: 06/04/2021 12:00 PM Injection made incrementally with aspirations every 5 mL.  Performed by: Personally  Anesthesiologist: Nolon Nations, MD  Additional Notes: BP cuff, SpO2 and EKG monitors applied. Sedation begun. Nerve location verified with ultrasound. Anesthetic injected incrementally, slowly, and after neg aspirations under direct u/s guidance. Good perineural spread. Tolerated well.

## 2021-06-04 NOTE — Anesthesia Preprocedure Evaluation (Addendum)
Anesthesia Evaluation  Patient identified by MRN, date of birth, ID band Patient awake    Reviewed: Allergy & Precautions, NPO status , Patient's Chart, lab work & pertinent test results  Airway Mallampati: II  TM Distance: >3 FB Neck ROM: Full    Dental no notable dental hx. (+) Teeth Intact, Dental Advisory Given   Pulmonary former smoker,    Pulmonary exam normal breath sounds clear to auscultation       Cardiovascular hypertension, Pt. on medications + CAD  Normal cardiovascular exam Rhythm:Regular Rate:Normal     Neuro/Psych Memory Problems Hx taken w Care giverpresentnegative neurological ROS  negative psych ROS   GI/Hepatic negative GI ROS, Neg liver ROS,   Endo/Other    Renal/GU negative Renal ROS     Musculoskeletal  (+) Arthritis , Rheumatoid disorders,    Abdominal   Peds  Hematology   Anesthesia Other Findings   Reproductive/Obstetrics                            Anesthesia Physical  Anesthesia Plan  ASA: 3  Anesthesia Plan: Regional   Post-op Pain Management:    Induction:   PONV Risk Score and Plan: 3 and Treatment may vary due to age or medical condition and Midazolam  Airway Management Planned: Natural Airway and Nasal Cannula  Additional Equipment: None  Intra-op Plan:   Post-operative Plan: Extubation in OR  Informed Consent: I have reviewed the patients History and Physical, chart, labs and discussed the procedure including the risks, benefits and alternatives for the proposed anesthesia with the patient or authorized representative who has indicated his/her understanding and acceptance.     Dental advisory given  Plan Discussed with: CRNA  Anesthesia Plan Comments:        Anesthesia Quick Evaluation

## 2021-06-04 NOTE — Anesthesia Procedure Notes (Signed)
Procedure Name: MAC Date/Time: 06/04/2021 1:50 PM Performed by: Bonney Aid, CRNA Pre-anesthesia Checklist: Patient identified, Emergency Drugs available, Suction available, Patient being monitored and Timeout performed Patient Re-evaluated:Patient Re-evaluated prior to induction Oxygen Delivery Method: Nasal cannula Placement Confirmation: positive ETCO2

## 2021-06-04 NOTE — Progress Notes (Signed)
Assisted Dr. Germeroth with left, ultrasound guided, axillary block. Side rails up, monitors on throughout procedure. See vital signs in flow sheet. Tolerated Procedure well. 

## 2021-06-04 NOTE — Op Note (Signed)
OPERATIVE NOTE  DATE OF PROCEDURE: 06/04/2021  SURGEONS:  Primary: Orene Desanctis, MD  PREOPERATIVE DIAGNOSIS: left wrist fracture status post open reduction internal fixation  POSTOPERATIVE DIAGNOSIS: Same  NAME OF PROCEDURE:   Left wrist removal of hardware (20680) Left wrist four view radiographs with interpretation  ANESTHESIA: Regional  SKIN PREPARATION: Hibiclens  ESTIMATED BLOOD LOSS: Minimal  IMPLANTS: none EXPLANTS: Biomet Crosslock volar locking plate and screws  INDICATIONS:  Bianca [name] is a 75 y.o. female who has the above preoperative diagnosis. The patient has decided to proceed with surgical intervention.  Risks, benefits and alternatives of operative management were discussed including, but not limited to, risks of anesthesia complications, infection, pain, persistent symptoms, stiffness, need for future surgery.  The patient understands, agrees and elects to proceed with surgery.    DESCRIPTION OF PROCEDURE: The patient was met in the pre-operative area and their identity was verified.  The operative location and laterality was also verified and marked.  The patient was brought to the OR and was placed supine on the table.  After repeat patient identification with the operative team anesthesia was provided and the patient was prepped and draped in the usual sterile fashion.  A final timeout was performed verifying the correction patient, procedure, location and laterality.    The left upper extremity was elevated and exsanguinated with an Esmarch and tourniquet inflated to 250 mmHg.  A flexor carpi radialis approach was performed with skin and subcutaneous tissues divided.  The interval between the flexor carpi radialis and the radial artery was entered and the pronator quadratus was lifted from the volar plate.  The hardware was identified and all of the screws were removed with a screwdriver.  This time a Valora Corporal was utilized to free the volar plate from the underlying bone  and the plate and screws were removed in their entirety.  The screw holes were curetted and thoroughly irrigated with normal saline.  At this time attention was turned to closure and wound 4 oh horizontal mattress nylon sutures were performed for closure.  The arm 4 view fluoroscopy of the left wrist was performed with intraoperative interpretation confirming removal of all of the hardware and adequate alignment of the fracture.  There does appear to be robust callus formation however there is still some mild motion at the fracture site.  There are some shortening of the radius compared to the ulna however now there is no further protruding hardware.  A sterile soft dressing was applied followed by a short arm plaster volar slab splint.  Tourniquet was deflated and the fingers were pink and warm and well-perfused.  All counts were correct x2.  Patient was awoken from anesthesia and brought to PACU for recovery in stable condition.   Matt Holmes, MD

## 2021-06-04 NOTE — Discharge Instructions (Addendum)
Orthopaedic Hand Surgery Discharge Instructions  WEIGHT BEARING STATUS: Non weight bearing on operative extremity  DRESSINGS: Please keep your dressing/splint/cast clean and dry until your follow-up appointment. You may shower by placing a waterproof covering over your dressing/splint/cast. Contact your surgeon if your splint/cast gets wet. It will need to be changed to prevent skin breakdown.  PAIN CONTROL: First line medications for post operative pain control are Tylenol (acetaminophen) and Motrin (ibuprofen) if you are able to take these medications. If you have been prescribed a medication these can be taken as breakthrough pain medications. Please note that some narcotic pain medication have acetaminophen added and you should never consume more than 4,000mg  of acetaminophen in 24 hour period. Also please note that if you are given Toradol (ketoralac) you should not take similar medications simultaneously such as ibuprofen.   ICE/ELEVATION: Ice and elevate your injured extremity as needed. Avoid direct contact of ice with skin.  HOME MEDICATIONS: No changes have been made to your home medications.  FOLLOW UP: You will be called after surgery with an appointment date and time, however if you have not received a phone call within 3 days please call during regular office hours at [phone] to schedule a post operative appointment.  Please Seek Medical Attention if: Call MD for: pain or pressure in chest, jaw, arm, back, neck  Call MD for: temperature greater than 101 F for more than 24 hours  Call MD for: difficulty breathing Call MD for: Incision redness, bleeding, drainage  Call MD for: palpitations or feeling that the heart is racing  Call MD for: increased swelling in arm, leg, ankle, or abdomen  Call MD for: lightheadedness, dizziness, fainting Go to ED or call 911 if: chest pain does not go away after 3 nitroglycerin doses taken 5 min apart  Go to ED or call 911 for: any  uncontrolled bleeding  Go to ED or call 911 if: unable to reach physician  Discharge Medications: OTC Tylenol / Ibuprofen for pain control postop as tolerated.  Allergies as of 06/04/2021   No Known Allergies      Medication List     TAKE these medications    aspirin 81 MG tablet Take 81 mg by mouth daily.   fexofenadine 180 MG tablet Commonly known as: ALLEGRA Take by mouth as needed. allergies   metoprolol succinate 50 MG 24 hr tablet Commonly known as: TOPROL-XL daily.   ondansetron 4 MG disintegrating tablet Commonly known as: Zofran ODT Take 1 tablet (4 mg total) by mouth every 8 (eight) hours as needed for nausea or vomiting.   OVER THE COUNTER MEDICATION Vitamin d 3 2 tabs daily   POTASSIUM BICARBONATE PO Take 550 mg by mouth daily.   pyridoxine 100 MG tablet Commonly known as: B-6 Take 100 mg by mouth daily.   rosuvastatin 20 MG tablet Commonly known as: CRESTOR Take 20 mg by mouth at bedtime.   telmisartan 80 MG tablet Commonly known as: MICARDIS Take 80 mg by mouth daily.   VITAMIN B 12 PO Take 1,000 mcg by mouth daily.   vitamin C 500 MG tablet Commonly known as: ASCORBIC ACID Take 500 mg by mouth daily.   VITAMIN D PO Take 1,000 Units by mouth daily.          Izell Mellette, MD Orthopaedic Hand Surgeon [facility] Office number: [phone] [address] [place], [place] [address]  Post Anesthesia Home Care Instructions  Activity: Get plenty of rest for the remainder of  the day. A responsible individual must stay with you for 24 hours following the procedure.  For the next 24 hours, DO NOT: -Drive a car -Paediatric nurse -Drink alcoholic beverages -Take any medication unless instructed by your physician -Make any legal decisions or sign important papers.  Meals: Start with liquid foods such as gelatin or soup. Progress to regular foods as tolerated. Avoid greasy, spicy, heavy foods. If nausea and/or vomiting  occur, drink only clear liquids until the nausea and/or vomiting subsides. Call your physician if vomiting continues.  Special Instructions/Symptoms: Your throat may feel dry or sore from the anesthesia or the breathing tube placed in your throat during surgery. If this causes discomfort, gargle with warm salt water. The discomfort should disappear within 24 hours.

## 2021-06-04 NOTE — Interval H&P Note (Signed)
History and Physical Interval Note:  06/04/2021 1:00 PM  Bianca [name]  has presented today for surgery, with the diagnosis of left wrist fracture status post open reduction internal fixation.  The various methods of treatment have been discussed with the patient and family. After consideration of risks, benefits and other options for treatment, the patient has consented to  Procedure(s): HARDWARE REMOVAL (Left) as a surgical intervention.  The patient's history has been reviewed, patient examined, no change in status, stable for surgery.  I have reviewed the patient's chart and labs.  Questions were answered to the patient's satisfaction.     Orene Desanctis

## 2021-06-04 NOTE — Transfer of Care (Signed)
Immediate Anesthesia Transfer of Care Note  Patient: Bianca [name]  Procedure(s) Performed: HARDWARE REMOVAL (Left: Wrist)  Patient Location: PACU and Short Stay  Anesthesia Type:MAC  Level of Consciousness: awake, alert  and oriented  Airway & Oxygen Therapy: Patient Spontanous Breathing  Post-op Assessment: Report given to RN  Post vital signs: Reviewed and stable  Last Vitals:  Vitals Value Taken Time  BP 149/96   Temp    Pulse 68   Resp    SpO2 100%     Last Pain:  Vitals:   06/04/21 1131  TempSrc: Oral  PainSc: 0-No pain      Patients Stated Pain Goal: 3 (53/61/44 3154)  Complications: No notable events documented.

## 2021-06-05 ENCOUNTER — Encounter (HOSPITAL_BASED_OUTPATIENT_CLINIC_OR_DEPARTMENT_OTHER): Payer: Self-pay | Admitting: Orthopedic Surgery

## 2021-06-05 DIAGNOSIS — S52502A Unspecified fracture of the lower end of left radius, initial encounter for closed fracture: Secondary | ICD-10-CM | POA: Diagnosis not present

## 2021-06-15 DIAGNOSIS — S52502D Unspecified fracture of the lower end of left radius, subsequent encounter for closed fracture with routine healing: Secondary | ICD-10-CM | POA: Diagnosis not present

## 2021-06-19 DIAGNOSIS — S52502A Unspecified fracture of the lower end of left radius, initial encounter for closed fracture: Secondary | ICD-10-CM | POA: Diagnosis not present

## 2021-06-24 DIAGNOSIS — J309 Allergic rhinitis, unspecified: Secondary | ICD-10-CM | POA: Diagnosis not present

## 2021-07-21 DIAGNOSIS — S52502A Unspecified fracture of the lower end of left radius, initial encounter for closed fracture: Secondary | ICD-10-CM | POA: Diagnosis not present

## 2021-11-17 DIAGNOSIS — Z780 Asymptomatic menopausal state: Secondary | ICD-10-CM | POA: Diagnosis not present

## 2022-03-01 ENCOUNTER — Ambulatory Visit: Payer: Medicare HMO | Admitting: Cardiovascular Disease

## 2022-03-01 ENCOUNTER — Encounter: Payer: Self-pay | Admitting: Cardiovascular Disease

## 2022-03-01 VITALS — BP 120/60 | HR 50 | Ht 65.75 in | Wt 132.4 lb

## 2022-03-01 DIAGNOSIS — I2510 Atherosclerotic heart disease of native coronary artery without angina pectoris: Secondary | ICD-10-CM | POA: Diagnosis not present

## 2022-03-01 DIAGNOSIS — I10 Essential (primary) hypertension: Secondary | ICD-10-CM | POA: Diagnosis not present

## 2022-03-01 DIAGNOSIS — E782 Mixed hyperlipidemia: Secondary | ICD-10-CM

## 2022-03-01 NOTE — Progress Notes (Signed)
Cardiology Office Note:    Date:  03/01/2022   ID:  Bianca [name], DOB [birth year], MRN [id number]  PCP:  Deland Pretty, [place] [facility] Cardiologist:  Sherren Mocha, MD     Referring MD: Deland Pretty, MD   Chief Complaint  Patient presents with   Coronary Artery Disease    History of Present Illness:    Bianca [name] is a 76 y.o. female presenting for follow-up of coronary artery disease and hyperlipidemia.  The patient was noted to have aortic atherosclerosis and three-vessel CAD based on a noncardiac CT scan several years ago.  She has been treated with risk factor modification using aspirin for antiplatelet therapy, beta-blocker, and statin drug.  The patient is here alone today with no cardiac-related complaints.  She remains physically [place] with no exertional symptoms.  She denies chest pain, chest pressure, or shortness of breath.  No lightheadedness, heart palpitations, or leg swelling.  No complaints at the time of today's evaluation.  She denies any changes in her medical regimen which includes aspirin for antiplatelet therapy, beta-blocker, and a statin drug.  The patient does not follow regularly with primary care.  Past Medical History:  Diagnosis Date   Arthritis    Coronary artery disease    Elevated cholesterol    Hypertension    ILD (interstitial lung disease) ([place])    mild ild per lov dr Elsworth [place] pumonary 10-21-2017   Left wrist fracture 05/04/2021   in sling   Memory problem 05/06/2021   no diagnosis signs owns consent sister in law peggy Tamala Julian helps due to spouse recent tonue surgery no formal poa   Poor historian 05/06/2021   no diagnosis but sister in law peggy Tamala Julian helps due to spouse recent tongue surgery    Past Surgical History:  Procedure Laterality Date   ABDOMINAL SURGERY  1988   Exp. Laparotomy -Micro tuboplasty   ANTERIOR CRUCIATE LIGAMENT REPAIR     COSMETIC SURGERY     HARDWARE REMOVAL Left 06/04/2021    Procedure: HARDWARE REMOVAL;  Surgeon: Orene Desanctis, MD;  Location: [facility];  Service: Orthopedics;  Laterality: Left;  9 screws and 1 plate   hemorrdectomy     NASAL SINUS SURGERY     OOPHORECTOMY  2009   BSO cystadenofibroma   ORIF WRIST FRACTURE Left 05/07/2021   Procedure: OPEN REDUCTION INTERNAL FIXATION (ORIF) WRIST FRACTURE;  Surgeon: Orene Desanctis, MD;  Location: [facility];  Service: Orthopedics;  Laterality: Left;  with MAC   PELVIC LAPAROSCOPY     DL with tubal lavage    Current Medications: Current Meds  Medication Sig   aspirin 81 MG tablet Take 81 mg by mouth daily.   Cholecalciferol (VITAMIN D PO) Take 1,000 Units by mouth daily.    Cyanocobalamin (VITAMIN B 12 PO) Take 1,000 mcg by mouth daily.    fexofenadine (ALLEGRA) 180 MG tablet Take by mouth as needed. allergies   metoprolol succinate (TOPROL-XL) 50 MG 24 hr tablet daily.   OVER THE COUNTER MEDICATION Vitamin d 3 2 tabs daily   POTASSIUM BICARBONATE PO Take 550 mg by mouth daily.   pyridoxine (B-6) 100 MG tablet Take 100 mg by mouth daily.   rosuvastatin (CRESTOR) 20 MG tablet Take 20 mg by mouth at bedtime.   telmisartan (MICARDIS) 80 MG tablet Take 80 mg by mouth daily.   vitamin C (ASCORBIC ACID) 500 MG tablet Take 500 mg by mouth  daily.     Allergies:   Patient has no known allergies.   Social History   Socioeconomic History   Marital status: Married    Spouse name: Not on file   Number of children: Not on file   Years of education: Not on file   Highest education level: Not on file  Occupational History   Not on file  Tobacco Use   Smoking status: Former    Types: Cigarettes   Smokeless tobacco: Never   Tobacco comments:     Smoking cigarettes Quit age 25 smoked age 9 to 69 1 ppd per pt  Vaping Use   Vaping Use: Never used  Substance and Sexual Activity   Alcohol use: Yes    Alcohol/week: 0.0 standard drinks of alcohol    Comment: Rare   Drug use: No    Sexual activity: Never    Birth control/protection: Post-menopausal    Comment: 1st intercourse 76 yo-Fewer than 5 partners  Other Topics Concern   Not on file  Social History Narrative   Not on file   Social Determinants of Health   Financial Resource Strain: Not on file  Food Insecurity: Not on file  Transportation Needs: Not on file  Physical Activity: Not on file  Stress: Not on file  Social Connections: Not on file     Family History: The patient's family history includes Heart disease in her father and paternal grandfather; Hypertension in her father; Pancreatic cancer in her mother; Stroke in her paternal grandfather.  ROS:   Please see the history of present illness.    All other systems reviewed and are negative.  EKGs/Labs/Other Studies Reviewed:    EKG:  EKG is not ordered today.    Recent Labs: 06/04/2021: BUN 16; Creatinine, Ser 0.80; Hemoglobin 11.2; Potassium 3.0; Sodium 142  Recent Lipid Panel    Component Value Date/Time   CHOL  12/06/2010 0840    147        ATP III CLASSIFICATION:  <200     mg/dL   Desirable  200-239  mg/dL   Borderline High  >=240    mg/dL   High          TRIG 86 12/06/2010 0840   HDL 63 12/06/2010 0840   CHOLHDL 2.3 12/06/2010 0840   VLDL 17 12/06/2010 0840   LDLCALC  12/06/2010 0840    67        Total Cholesterol/HDL:CHD Risk Coronary Heart Disease Risk Table                     Men   Women  1/2 Average Risk   3.4   3.3  Average Risk       5.0   4.4  2 X Average Risk   9.6   7.1  3 X Average Risk  23.4   11.0        Use the calculated Patient Ratio above and the CHD Risk Table to determine the patient's CHD Risk.        ATP III CLASSIFICATION (LDL):  <100     mg/dL   Optimal  100-129  mg/dL   Near or Above                    Optimal  130-159  mg/dL   Borderline  160-189  mg/dL   High  >190     mg/dL   Very High     Risk Assessment/Calculations:  Physical Exam:    VS:  BP 120/60   Pulse (!) 50    Ht 5' 5.75" (1.67 m)   Wt 132 lb 6.4 oz (60.1 kg)   SpO2 96%   BMI 21.53 kg/m     Wt Readings from Last 3 Encounters:  03/01/22 132 lb 6.4 oz (60.1 kg)  06/04/21 129 lb 14.4 oz (58.9 kg)  05/07/21 133 lb (60.3 kg)     GEN:  Well nourished, well developed in no acute distress HEENT: Normal NECK: No JVD; No carotid bruits LYMPHATICS: No lymphadenopathy CARDIAC: RRR, no murmurs, rubs, gallops RESPIRATORY:  Clear to auscultation without rales, wheezing or rhonchi  ABDOMEN: Soft, non-tender, non-distended MUSCULOSKELETAL:  No edema; No deformity  SKIN: Warm and dry NEUROLOGIC:  Alert and oriented x 3 PSYCHIATRIC:  Normal affect   ASSESSMENT:    1. Hyperlipidemia, mixed   2. Coronary artery disease involving native coronary artery of native heart without angina pectoris   3. Essential hypertension   4. Mixed hyperlipidemia    PLAN:    In order of problems listed above:  The patient is treated with rosuvastatin and she tolerates this well.  I am going to update her lipids and LFTs.  She leads a healthy, active lifestyle. Diagnoses based on incidental three-vessel coronary calcification on a remote chest CT.  Exercise stress testing in the past has been negative.  The patient is active [place] her [place], doing a lot of physical work with no angina.  She will continue on aspirin, metoprolol, and rosuvastatin. Blood pressure is well controlled on telmisartan and metoprolol succinate.  Need to update her metabolic panel as she had hypokalemia at the time of her last labs.  She does not have a primary care physician so I will check this. Treated with rosuvastatin 20 mg daily.  Will date lipids and LFTs.           Medication Adjustments/Labs and Tests Ordered: Current medicines are reviewed at length with the patient today.  Concerns regarding medicines are outlined above.  Orders Placed This Encounter  Procedures   CBC   Comprehensive metabolic panel   Lipid panel   No  orders of the defined types were placed in this encounter.   Patient Instructions  Medication Instructions:  Your physician recommends that you continue on your current medications as directed. Please refer to the Current Medication list given to you today.  *If you need a refill on your cardiac medications before your next appointment, please call your pharmacy*   Lab Work: CBC, CMET, LIPIDS If you have labs (blood work) drawn today and your tests are completely normal, you will receive your results only by: [place] (if you have MyChart) OR A paper copy in the mail If you have any lab test that is abnormal or we need to change your treatment, we will call you to review the results.   Testing/Procedures: NONE   Follow-Up: At [facility], you and your health needs are our priority.  As part of our continuing mission to provide you with exceptional heart care, we have created designated Provider Care Teams.  These Care Teams include your primary Cardiologist (physician) and Advanced Practice Providers (APPs -  Physician Assistants and Nurse Practitioners) who all work together to provide you with the care you need, when you need it.  We recommend signing up for the patient portal called "MyChart".  Sign up information is provided on this After Visit Summary.  MyChart  is used to connect with patients for Virtual Visits (Telemedicine).  Patients are able to view lab/test results, encounter notes, upcoming appointments, etc.  Non-urgent messages can be sent to your provider as well.   To learn more about what you can do with MyChart, go to [email or web address].    Your next appointment:   1 year(s)  The format for your next appointment:   In Person  Provider:   Sherren Mocha, MD      Important Information About Sugar         Signed, Sherren Mocha, MD  03/01/2022 5:33 PM    [place]

## 2022-03-01 NOTE — Patient Instructions (Signed)
Medication Instructions:  Your physician recommends that you continue on your current medications as directed. Please refer to the Current Medication list given to you today.  *If you need a refill on your cardiac medications before your next appointment, please call your pharmacy*   Lab Work: CBC, CMET, LIPIDS If you have labs (blood work) drawn today and your tests are completely normal, you will receive your results only by: [facility] (if you have MyChart) OR A paper copy in the mail If you have any lab test that is abnormal or we need to change your treatment, we will call you to review the results.   Testing/Procedures: NONE   Follow-Up: At [facility], you and your health needs are our priority.  As part of our continuing mission to provide you with exceptional heart care, we have created designated Provider Care Teams.  These Care Teams include your primary Cardiologist (physician) and Advanced Practice Providers (APPs -  Physician Assistants and Nurse Practitioners) who all work together to provide you with the care you need, when you need it.  We recommend signing up for the patient portal called "MyChart".  Sign up information is provided on this After Visit Summary.  MyChart is used to connect with patients for Virtual Visits (Telemedicine).  Patients are able to view lab/test results, encounter notes, upcoming appointments, etc.  Non-urgent messages can be sent to your provider as well.   To learn more about what you can do with MyChart, go to [email or web address].    Your next appointment:   1 year(s)  The format for your next appointment:   In Person  Provider:   Sherren Mocha, MD      Important Information About Sugar

## 2022-03-03 ENCOUNTER — Other Ambulatory Visit: Payer: Medicare HMO

## 2022-03-03 DIAGNOSIS — E782 Mixed hyperlipidemia: Secondary | ICD-10-CM | POA: Diagnosis not present

## 2022-03-03 DIAGNOSIS — I10 Essential (primary) hypertension: Secondary | ICD-10-CM | POA: Diagnosis not present

## 2022-03-03 DIAGNOSIS — I2510 Atherosclerotic heart disease of native coronary artery without angina pectoris: Secondary | ICD-10-CM

## 2022-03-04 LAB — COMPREHENSIVE METABOLIC PANEL
ALT: 15 IU/L (ref 0–32)
AST: 26 IU/L (ref 0–40)
Albumin/Globulin Ratio: 1.8 (ref 1.2–2.2)
Albumin: 4.6 g/dL (ref 3.8–4.8)
Alkaline Phosphatase: 71 IU/L (ref 44–121)
BUN/Creatinine Ratio: 19 (ref 12–28)
BUN: 22 mg/dL (ref 8–27)
Bilirubin Total: 0.6 mg/dL (ref 0.0–1.2)
CO2: 20 mmol/L (ref 20–29)
Calcium: 9.5 mg/dL (ref 8.7–10.3)
Chloride: 103 mmol/L (ref 96–106)
Creatinine, Ser: 1.13 mg/dL — ABNORMAL HIGH (ref 0.57–1.00)
Globulin, Total: 2.5 g/dL (ref 1.5–4.5)
Glucose: 95 mg/dL (ref 70–99)
Potassium: 4.5 mmol/L (ref 3.5–5.2)
Sodium: 140 mmol/L (ref 134–144)
Total Protein: 7.1 g/dL (ref 6.0–8.5)
eGFR: 50 mL/min/{1.73_m2} — ABNORMAL LOW (ref >59)

## 2022-03-04 LAB — CBC
Hematocrit: 31.2 % — ABNORMAL LOW (ref 34.0–46.6)
Hemoglobin: 10.4 g/dL — ABNORMAL LOW (ref 11.1–15.9)
MCH: 30.1 pg (ref 26.6–33.0)
MCHC: 33.3 g/dL (ref 31.5–35.7)
MCV: 90 fL (ref 79–97)
Platelets: 164 10*3/uL (ref 150–450)
RBC: 3.46 x10E6/uL — ABNORMAL LOW (ref 3.77–5.28)
RDW: 12.8 % (ref 11.7–15.4)
WBC: 4.6 10*3/uL (ref 3.4–10.8)

## 2022-03-04 LAB — LIPID PANEL
Chol/HDL Ratio: 1.9 ratio (ref 0.0–4.4)
Cholesterol, Total: 149 mg/dL (ref 100–199)
HDL: 77 mg/dL (ref >39)
LDL Chol Calc (NIH): 57 mg/dL (ref 0–99)
Triglycerides: 82 mg/dL (ref 0–149)
VLDL Cholesterol Cal: 15 mg/dL (ref 5–40)

## 2022-04-02 DIAGNOSIS — I10 Essential (primary) hypertension: Secondary | ICD-10-CM | POA: Diagnosis not present

## 2022-04-02 DIAGNOSIS — E7800 Pure hypercholesterolemia, unspecified: Secondary | ICD-10-CM | POA: Diagnosis not present

## 2022-08-17 DIAGNOSIS — I700 Atherosclerosis of aorta: Secondary | ICD-10-CM | POA: Diagnosis not present

## 2022-08-17 DIAGNOSIS — R413 Other amnesia: Secondary | ICD-10-CM | POA: Diagnosis not present

## 2022-08-17 DIAGNOSIS — I10 Essential (primary) hypertension: Secondary | ICD-10-CM | POA: Diagnosis not present

## 2022-08-17 DIAGNOSIS — J849 Interstitial pulmonary disease, unspecified: Secondary | ICD-10-CM | POA: Diagnosis not present

## 2022-08-17 DIAGNOSIS — E785 Hyperlipidemia, unspecified: Secondary | ICD-10-CM | POA: Diagnosis not present

## 2022-08-17 DIAGNOSIS — N1831 Chronic kidney disease, stage 3a: Secondary | ICD-10-CM | POA: Diagnosis not present

## 2022-08-17 DIAGNOSIS — Z0000 Encounter for general adult medical examination without abnormal findings: Secondary | ICD-10-CM | POA: Diagnosis not present

## 2022-08-17 DIAGNOSIS — I2510 Atherosclerotic heart disease of native coronary artery without angina pectoris: Secondary | ICD-10-CM | POA: Diagnosis not present

## 2022-10-07 DIAGNOSIS — R413 Other amnesia: Secondary | ICD-10-CM | POA: Diagnosis not present

## 2022-10-11 ENCOUNTER — Other Ambulatory Visit (HOSPITAL_COMMUNITY): Payer: Self-pay | Admitting: Internal Medicine

## 2022-10-11 DIAGNOSIS — R413 Other amnesia: Secondary | ICD-10-CM

## 2022-10-13 DIAGNOSIS — R413 Other amnesia: Secondary | ICD-10-CM | POA: Diagnosis not present

## 2022-11-04 DIAGNOSIS — R413 Other amnesia: Secondary | ICD-10-CM | POA: Diagnosis not present

## 2022-11-05 ENCOUNTER — Ambulatory Visit (HOSPITAL_COMMUNITY)
Admission: RE | Admit: 2022-11-05 | Discharge: 2022-11-05 | Disposition: A | Discharge status: Home / Self Care | Payer: Medicare HMO | Source: Ambulatory Visit | Attending: Internal Medicine | Admitting: Internal Medicine

## 2022-11-05 DIAGNOSIS — R413 Other amnesia: Secondary | ICD-10-CM | POA: Diagnosis not present

## 2022-11-18 ENCOUNTER — Encounter: Payer: Medicare HMO | Admitting: Psychology

## 2022-12-08 ENCOUNTER — Encounter: Payer: Self-pay | Admitting: Physician Assistant

## 2022-12-31 NOTE — Progress Notes (Addendum)
Assessment/Plan:    The patient is seen in neurologic consultation at the request of Merri Brunette, MD for the evaluation of memory.  Bianca [name] is a very pleasant 77 y.o. year old RH female with a history of hypertension, hyperlipidemia, chronic back and neck pain, IBS, history multinodular goiter, seen today for evaluation of memory loss. MoCA today is 13/30. Personally reviewed MRI of the brain from March 2024 is remarkable for advanced generalized volume loss with temporal lobe predominance, and moderate chronic microvascular ischemic disease. She was on donepezil 5 mg daily, but has been missing some doses. Niece to provide a tighter control on the meds. Findings are suspicious for dementia likely due to AD with vascular component. She is able to perform her ADLs. She no longer drives  Memory Impairment   Neurocognitive testing to further evaluate cognitive concerns and determine other underlying cause of memory changes, including potential contribution from sleep, anxiety, or depression  Continue donepezil, recommend increasing the dose to 10 mg daily as per PCP per patient's request Check B12, TSH [place] up in 3 months  Subjective:    The patient is accompanied by her niece  who supplements the history.    How long did patient have memory difficulties?  Her husband died of cancer in 07-30-22. She began showing some cognitive decline around that time, therefore her family took her to her PCP. He suspected dementia with a situational depression component . He placed her on Aricept 5 mg daily, but lately she was not compliant.  Patient has some difficulty remembering recent conversations and people names, she needs more time to retrieve the information. LTM is good. She is very active, enjoys weaving, planting flowers in her "[address] [place]" repeats oneself?  Endorsed by her niece. Usually about subjects that involve her family Disoriented when walking into a room?  Patient  denies  Leaving objects in unusual places?   denies   Wandering behavior? denies   Any personality changes ? denies   Any history of depression?: denies.   Hallucinations or paranoia?  Not frequently.  "She smelled smoke in the middle of the night, a door bell ringing and even called the police for that. . In the past she saw some lights at the end of the farm when she was taking care of her husband, but not recently.  Seizures? denies    Any sleep changes?  Denies  vivid dreams, REM behavior or sleepwalking   Sleep apnea? denies   Any hygiene concerns?  Less frequent than before.  Independent of bathing and dressing?  Endorsed  Does the patient need help with medications? Patient is in charge may be missing some doses. Who is in charge of the finances?  Family is in charge     Any changes in appetite?   denies     Patient have trouble swallowing?  denies   Does the patient cook? "When I have to".    Any kitchen accidents such as leaving the stove on? Patient denies   Any headaches?  denies   Chronic back pain?  denies   Ambulates with difficulty? denies   Recent falls or head injuries? denies     Vision changes? Unilateral weakness, numbness or tingling? denies   Any tremors?  denies   Any anosmia?  denies   Any incontinence of urine? denies   Any bowel dysfunction? denies      Patient lives alone since her husband's death.  History of heavy alcohol intake? denies   History of heavy tobacco use? denies   Family history of dementia?  Denies     Does patient drive? Endorsed, locally. Some family members help out as well.   No Known Allergies  Current Outpatient Medications  Medication Instructions   ascorbic acid (VITAMIN C) 500 mg, Daily   aspirin 81 mg, Daily   Cholecalciferol (VITAMIN D PO) 1,000 Units, Daily   Cyanocobalamin (VITAMIN B 12 PO) 1,000 mcg, Oral, Daily   fexofenadine (ALLEGRA) 180 MG tablet Oral, As needed, allergies   metoprolol succinate (TOPROL-XL) 50  MG 24 hr tablet Daily   ondansetron (ZOFRAN ODT) 4 mg, Oral, Every 8 hours PRN   OVER THE COUNTER MEDICATION Vitamin d 3 2 tabs daily   POTASSIUM BICARBONATE PO 550 mg, Daily   pyridoxine (B-6) 100 mg, Oral, Daily   rosuvastatin (CRESTOR) 20 mg, Daily at bedtime   telmisartan (MICARDIS) 80 mg, Oral, Daily     VITALS:   Vitals:   01/03/23 1315  BP: 135/75  Pulse: (!) 6  SpO2: 97%  Weight: 144 lb (65.3 kg)  Height: 5\' 6"  (1.676 m)       No data to display          PHYSICAL EXAM   HEENT:  Normocephalic, atraumatic. The mucous membranes are moist. The superficial temporal arteries are without ropiness or tenderness. Cardiovascular: Regular rate and rhythm. Lungs: Clear to auscultation bilaterally. Neck: There are no carotid bruits noted bilaterally.  NEUROLOGICAL:    01/03/2023    5:00 PM  Montreal Cognitive Assessment   Visuospatial/ Executive (0/5) 4  Naming (0/3) 2  Attention: Read list of digits (0/2) 2  Attention: Read list of letters (0/1) 1  Attention: Serial 7 subtraction starting at 100 (0/3) 1  Language: Repeat phrase (0/2) 0  Language : Fluency (0/1) 0  Abstraction (0/2) 0  Delayed Recall (0/5) 0  Orientation (0/6) 3  Total 13  Adjusted Score (based on education) 13        No data to display           Orientation:  Alert and oriented to person, place and time. No aphasia or dysarthria. Fund of knowledge is appropriate. Recent  and remote memory impaired. Attention and concentration are reduced.  Able to name objects and unable to repeat phrases. Delayed recall 0/5 Cranial nerves: There is good facial symmetry. Extraocular muscles are intact and visual fields are full to confrontational testing. Speech is fluent and clear. no tongue deviation. Hearing is intact to conversational tone.  Tone: Tone is good throughout. Sensation: Sensation is intact to light touch and pinprick throughout. Vibration is intact at the bilateral big toe.There is no  extinction with double simultaneous stimulation.   Coordination: The patient has no difficulty with RAM's or FNF bilaterally. Normal finger to nose  Motor: Strength is 5/5 in the bilateral upper and lower extremities. There is no pronator drift. There are no fasciculations noted. DTR's: Deep tendon reflexes are 2/4 at the bilateral biceps, triceps, brachioradialis, patella and achilles.  Plantar responses are downgoing bilaterally. Gait and Station: The patient is able to ambulate without difficulty.The patient is able to ambulate in a tandem fashion, able to stand in the Romberg position.     Thank you for allowing [place] the opportunity to participate in the care of this nice patient. Please do not hesitate to contact us for any questions or concerns.   Total time spent on today's visit  was 63 minutes dedicated to this patient today, preparing to see patient, examining the patient, ordering tests and/or medications and counseling the patient, documenting clinical information in the EHR or other health record, independently interpreting results and communicating results to the patient/family, discussing treatment and goals, answering patient's questions and coordinating care.  Cc:  Merri Brunette, MD  Marlowe Kays 01/03/2023 6:09 PM

## 2023-01-03 ENCOUNTER — Ambulatory Visit: Payer: Medicare HMO | Admitting: Physician Assistant

## 2023-01-03 ENCOUNTER — Encounter: Payer: Self-pay | Admitting: Physician Assistant

## 2023-01-03 ENCOUNTER — Ambulatory Visit: Payer: Medicare HMO

## 2023-01-03 ENCOUNTER — Other Ambulatory Visit (INDEPENDENT_AMBULATORY_CARE_PROVIDER_SITE_OTHER): Payer: Medicare HMO

## 2023-01-03 VITALS — BP 135/75 | HR 6 | Ht 66.0 in | Wt 144.0 lb

## 2023-01-03 DIAGNOSIS — R413 Other amnesia: Secondary | ICD-10-CM | POA: Diagnosis not present

## 2023-01-03 NOTE — Patient Instructions (Addendum)
It was a pleasure to see you today at our office.   Recommendations:  Follow up Aug 2 at 11:30  Check labs  Continue Aricept, recommend increasing dosing to 10 mg daily  You have been referred for a neuropsychological evaluation (i.e., evaluation of memory and thinking abilities).      For assessment of decision of mental capacity and competency:  Call Dr. Erick Blinks, geriatric psychiatrist at [phone] Counseling regarding caregiver distress, including caregiver depression, anxiety and issues regarding community resources, adult day care programs, adult living facilities, or memory care questions:  please contact your  Primary Doctor's Social Worker  Whom to call: Memory  decline, memory medications: Call our office [phone]  For psychiatric meds, mood meds: Please have your primary care physician manage these medications.  If you have any severe symptoms of a stroke, or other severe issues such as confusion,severe chills or fever, etc call 911 or go to the ER as you may need to be evaluated further      RECOMMENDATIONS FOR ALL PATIENTS WITH MEMORY PROBLEMS: 1. Continue to exercise (Recommend 30 minutes of walking everyday, or 3 hours every week) 2. Increase social interactions - continue going to [place] and enjoy social gatherings with friends and family 3. Eat healthy, avoid fried foods and eat more fruits and vegetables 4. Maintain adequate blood pressure, blood sugar, and blood cholesterol level. Reducing the risk of stroke and cardiovascular disease also helps promoting better memory. 5. Avoid stressful situations. Live a simple life and avoid aggravations. Organize your time and prepare for the next day in anticipation. 6. Sleep well, avoid any interruptions of sleep and avoid any distractions in the bedroom that may interfere with adequate sleep quality 7. Avoid sugar, avoid sweets as there is a strong link between excessive sugar intake, diabetes, and cognitive  impairment We discussed the Mediterranean diet, which has been shown to help patients reduce the risk of progressive memory disorders and reduces cardiovascular risk. This includes eating fish, eat fruits and green leafy vegetables, nuts like almonds and hazelnuts, walnuts, and also use olive oil. Avoid fast foods and fried foods as much as possible. Avoid sweets and sugar as sugar use has been linked to worsening of memory function.  There is always a concern of gradual progression of memory problems. If this is the case, then we may need to adjust level of care according to patient needs. Support, both to the patient and caregiver, should then be put into place.    The Alzheimer's Association is here all day, every day for people facing Alzheimer's disease through our free 24/7 Helpline: [phone]. The Helpline provides reliable information and support to all those who need assistance, such as individuals living with memory loss, Alzheimer's or other dementia, caregivers, health care professionals and the public.  Our highly trained and knowledgeable staff can help you with: Understanding memory loss, dementia and Alzheimer's  Medications and other treatment options  General information about aging and brain health  Skills to provide quality care and to find the best care from professionals  Legal, financial and living-arrangement decisions Our Helpline also features: Confidential care consultation provided by master's level clinicians who can help with decision-making support, crisis assistance and education on issues families face every day  Help in a caller's preferred language using our translation service that features more than 200 languages and dialects  Referrals to local community programs, services and ongoing support     FALL PRECAUTIONS: Be cautious when walking. Scan  the area for obstacles that may increase the risk of trips and falls. When getting up in the mornings, sit up at  the edge of the bed for a few minutes before getting out of bed. Consider elevating the bed at the head end to avoid drop of blood pressure when getting up. Walk always in a well-lit room (use night lights in the walls). Avoid area rugs or power cords from appliances in the middle of the walkways. Use a walker or a cane if necessary and consider physical therapy for balance exercise. Get your eyesight checked regularly.  FINANCIAL OVERSIGHT: Supervision, especially oversight when making financial decisions or transactions is also recommended.  HOME SAFETY: Consider the safety of the kitchen when operating appliances like stoves, microwave oven, and blender. Consider having supervision and share cooking responsibilities until no longer able to participate in those. Accidents with firearms and other hazards in the house should be identified and addressed as well.   ABILITY TO BE LEFT ALONE: If patient is unable to contact 911 operator, consider using LifeLine, or when the need is there, arrange for someone to stay with patients. Smoking is a fire hazard, consider supervision or cessation. Risk of wandering should be assessed by caregiver and if detected at any point, supervision and safe proof recommendations should be instituted.  MEDICATION SUPERVISION: Inability to self-administer medication needs to be constantly addressed. Implement a mechanism to ensure safe administration of the medications.   DRIVING: Regarding driving, in patients with progressive memory problems, driving will be impaired. We advise to have someone else do the driving if trouble finding directions or if minor accidents are reported. Independent driving assessment is available to determine safety of driving.   If you are interested in the driving assessment, you can contact the following:  [facility] [employer] in [place] [phone]  [facility] [phone]  [facility]  [phone] or [phone]      Mediterranean Diet A Mediterranean diet refers to food and lifestyle choices that are based on the traditions of countries located on the [employer]. This way of eating has been shown to help prevent certain conditions and improve outcomes for people who have chronic diseases, like kidney disease and heart disease. What are tips for following this plan? Lifestyle  Cook and eat meals together with your family, when possible. Drink enough fluid to keep your urine clear or pale yellow. Be physically active every day. This includes: Aerobic exercise like running or swimming. Leisure activities like gardening, walking, or housework. Get 7-8 hours of sleep each night. If recommended by your health care provider, drink red wine in moderation. This means 1 glass a day for nonpregnant women and 2 glasses a day for men. A glass of wine equals 5 oz (150 mL). Reading food labels  Check the serving size of packaged foods. For foods such as rice and pasta, the serving size refers to the amount of cooked product, not dry. Check the total fat in packaged foods. Avoid foods that have saturated fat or trans fats. Check the ingredients list for added sugars, such as corn syrup. Shopping  At the grocery store, buy most of your food from the areas near the walls of the store. This includes: Fresh fruits and vegetables (produce). Grains, beans, nuts, and seeds. Some of these may be available in unpackaged forms or large amounts (in bulk). Fresh seafood. Poultry and eggs. Low-fat dairy products. Buy whole ingredients instead of prepackaged foods.  Buy fresh fruits and vegetables in-season from local farmers markets. Buy frozen fruits and vegetables in resealable bags. If you do not have access to quality fresh seafood, buy precooked frozen shrimp or canned fish, such as tuna, salmon, or sardines. Buy small amounts of raw or cooked  vegetables, salads, or olives from the deli or salad bar at your store. Stock your pantry so you always have certain foods on hand, such as olive oil, canned tuna, canned tomatoes, rice, pasta, and beans. Cooking  Cook foods with extra-virgin olive oil instead of using butter or other vegetable oils. Have meat as a side dish, and have vegetables or grains as your main dish. This means having meat in small portions or adding small amounts of meat to foods like pasta or stew. Use beans or vegetables instead of meat in common dishes like chili or lasagna. Experiment with different cooking methods. Try roasting or broiling vegetables instead of steaming or sauteing them. Add frozen vegetables to soups, stews, pasta, or rice. Add nuts or seeds for added healthy fat at each meal. You can add these to yogurt, salads, or vegetable dishes. Marinate fish or vegetables using olive oil, lemon juice, garlic, and fresh herbs. Meal planning  Plan to eat 1 vegetarian meal one day each week. Try to work up to 2 vegetarian meals, if possible. Eat seafood 2 or more times a week. Have healthy snacks readily available, such as: Vegetable sticks with hummus. Greek yogurt. Fruit and nut trail mix. Eat balanced meals throughout the week. This includes: Fruit: 2-3 servings a day Vegetables: 4-5 servings a day Low-fat dairy: 2 servings a day Fish, poultry, or lean meat: 1 serving a day Beans and legumes: 2 or more servings a week Nuts and seeds: 1-2 servings a day Whole grains: 6-8 servings a day Extra-virgin olive oil: 3-4 servings a day Limit red meat and sweets to only a few servings a month What are my food choices? Mediterranean diet Recommended Grains: Whole-grain pasta. Brown rice. Bulgar wheat. Polenta. Couscous. Whole-wheat bread. Orpah Cobb. Vegetables: Artichokes. Beets. Broccoli. Cabbage. Carrots. Eggplant. Green beans. Chard. Kale. Spinach. Onions. Leeks. Peas. Squash. Tomatoes. Peppers.  Radishes. Fruits: Apples. Apricots. Avocado. Berries. Bananas. Cherries. Dates. Figs. Grapes. Lemons. Melon. Oranges. Peaches. Plums. Pomegranate. Meats and other protein foods: Beans. Almonds. Sunflower seeds. Pine nuts. Peanuts. Cod. Salmon. Scallops. Shrimp. Tuna. Tilapia. Clams. Oysters. Eggs. Dairy: Low-fat milk. Cheese. Greek yogurt. Beverages: Water. Red wine. Herbal tea. Fats and oils: Extra virgin olive oil. Avocado oil. Grape seed oil. Sweets and desserts: [place] yogurt with honey. Baked apples. Poached pears. Trail mix. Seasoning and other foods: Basil. Cilantro. Coriander. Cumin. Mint. Parsley. Sage. Rosemary. Tarragon. Garlic. Oregano. Thyme. Pepper. Balsalmic vinegar. Tahini. Hummus. Tomato sauce. Olives. Mushrooms. Limit these Grains: Prepackaged pasta or rice dishes. Prepackaged cereal with added sugar. Vegetables: Deep fried potatoes (french fries). Fruits: Fruit canned in syrup. Meats and other protein foods: Beef. Pork. Lamb. Poultry with skin. Hot dogs. Tomasa Blase. Dairy: Ice cream. Sour cream. Whole milk. Beverages: Juice. Sugar-sweetened soft drinks. Beer. Liquor and spirits. Fats and oils: Butter. Canola oil. Vegetable oil. Beef fat (tallow). Lard. Sweets and desserts: Cookies. Cakes. Pies. Candy. Seasoning and other foods: Mayonnaise. Premade sauces and marinades. The items listed may not be a complete list. Talk with your dietitian about what dietary choices are right for you. Summary The Mediterranean diet includes both food and lifestyle choices. Eat a variety of fresh fruits and vegetables, beans, nuts, seeds, and whole grains. Limit the  amount of red meat and sweets that you eat. Talk with your health care provider about whether it is safe for you to drink red wine in moderation. This means 1 glass a day for nonpregnant women and 2 glasses a day for men. A glass of wine equals 5 oz (150 mL). This information is not intended to replace advice given to you by your health  care provider. Make sure you discuss any questions you have with your health care provider. Document Released: 03/25/2016 Document Revised: 04/27/2016 Document Reviewed: 03/25/2016 Elsevier Interactive Patient Education  2017 [employer].

## 2023-01-04 DIAGNOSIS — R413 Other amnesia: Secondary | ICD-10-CM | POA: Diagnosis not present

## 2023-01-04 LAB — TSH: TSH: 1.37 u[IU]/mL (ref 0.35–5.50)

## 2023-01-04 LAB — VITAMIN B12: Vitamin B-12: 836 pg/mL (ref 211–911)

## 2023-01-04 NOTE — Progress Notes (Signed)
B12 and thyroid levels are normal, follow with PCP, thanks

## 2023-01-19 DIAGNOSIS — R5383 Other fatigue: Secondary | ICD-10-CM | POA: Diagnosis not present

## 2023-03-09 ENCOUNTER — Telehealth: Payer: Self-pay | Admitting: Cardiovascular Disease

## 2023-03-09 DIAGNOSIS — R413 Other amnesia: Secondary | ICD-10-CM | POA: Diagnosis not present

## 2023-03-09 DIAGNOSIS — R5381 Other malaise: Secondary | ICD-10-CM | POA: Diagnosis not present

## 2023-03-09 DIAGNOSIS — R031 Nonspecific low blood-pressure reading: Secondary | ICD-10-CM | POA: Diagnosis not present

## 2023-03-09 NOTE — Telephone Encounter (Signed)
Patient's niece/POA wants a call back to discuss patient's care.

## 2023-03-09 NOTE — Telephone Encounter (Signed)
Spoke with Annabelle Harman, niece (POA).   (She is a Garment/textile technologist) She is calling b/c pt has memory issues and needs to have someone with her at [facility].  However, they were unaware of tomorrow's scheduled yearly f/u appt and cannot make it.  Aware appt cancelled and will forward to RN to address r/s this year.  Please call Annabelle Harman /@ [phone] She understands it may be next week before nurse follows up. She is fine with this and thanks me for calling.

## 2023-03-10 ENCOUNTER — Ambulatory Visit: Payer: Medicare HMO | Admitting: Cardiovascular Disease

## 2023-03-10 NOTE — Telephone Encounter (Signed)
Called and spoke with Annabelle Harman (niece) and got pt rescheduled with Dr Excell Seltzer. Updated contact info in chart to reflect Dana's number as primary and Scott (other POA, nephew) as secondary.

## 2023-03-18 ENCOUNTER — Encounter: Payer: Self-pay | Admitting: Physician Assistant

## 2023-03-18 ENCOUNTER — Ambulatory Visit: Payer: Medicare HMO | Admitting: Physician Assistant

## 2023-03-18 VITALS — BP 127/77 | HR 77 | Resp 18 | Ht 66.0 in | Wt 139.0 lb

## 2023-03-18 DIAGNOSIS — R413 Other amnesia: Secondary | ICD-10-CM

## 2023-03-18 NOTE — Progress Notes (Signed)
Assessment/Plan:   Dementia likely due to Alzheimer's disease   Bianca [name] is a very pleasant 77 y.o. RH female with a history of memory impairment likely due to Alzheimer's disease seen today in follow up for memory loss. Patient is currently on donepezil 5 mg daily, after having significant GI side effects with 10 mg dose,  hypertension, hyperlipidemia, chronic back and neck pain, IBS, history multinodular goiter.  Memory is stable, with neuropsych evaluation pending.  We discussed changing the medication to rivastigmine and see if he were to be better tolerated, in 24 for better coverage, but patient declines at this time.  Other option would be adding memantine, patient at this moment declines any changes of the meds for now.    Follow up in 6 months. Neuropsych evaluation is scheduled for March 2025 for clarity of the diagnosis and disease trajectory. Continue donepezil 5 mg daily as per PCP, side effects discussed Recommend good control of her cardiovascular risk factors Continue to control mood as per PCP     Subjective:    This patient is accompanied in the office by her family member who supplements the history.  Previous records as well as any outside records available were reviewed prior to todays visit. Patient was last seen on 01/03/2023, with a MoCA of 13/30.    Any changes in memory since last visit? "About the same".  "LTM is good.  "She is very active, enjoys reading, planting flowers in her [place] [place] Uses sticky notes to remember. Has some friends that visit her and keeps  her busy.  She continues to have difficulty remembering conversations. repeats oneself?  Endorsed by her niece. Disoriented when walking into a room?  Patient denies    Leaving objects in unusual places?  May misplace things but not in unusual places   Wandering behavior?  denies   Any personality changes since last visit?  denies   Any worsening depression?:  Denies.    Hallucinations or paranoia?Denies  Seizures? denies    Any sleep changes?  Sleeps well . Denies vivid dreams, REM behavior or sleepwalking   Sleep apnea?   Denies.   Any hygiene concerns?  She showers less frequently than before. Independent of bathing and dressing?  Endorsed  Does the patient needs help with medications?  Patient is in charge, she uses the pillbox  Who is in charge of the finances?  Family is in charge     Any changes in appetite?  Denies.     Patient have trouble swallowing? Denies.   Does the patient cook?  Not frequently. Any headaches?   denies   Chronic back pain  denies   Ambulates with difficulty? Denies.    Recent falls or head injuries? denies     Unilateral weakness, numbness or tingling? denies   Any tremors?  Denies   Any anosmia?  Denies   Any incontinence of urine?  Denies  Any bowel dysfunction?   Denies      Patient lives alone since her husband's death  Does the patient drive? Does not drive, and some family members help out as well.    Initial visit 01/03/2023 How long did patient have memory difficulties?  Her husband died of cancer in August 10, 2022. She began showing some cognitive decline around that time, therefore her family took her to her PCP. He suspected dementia with a situational depression component . He placed her on Aricept 5 mg daily, but lately she was  not compliant.  Patient has some difficulty remembering recent conversations and people names, she needs more time to retrieve the information. LTM is good. She is very active, enjoys weaving, planting flowers in her [place]" repeats oneself?  Endorsed by her niece. Usually about subjects that involve her family Disoriented when walking into a room?  Patient denies  Leaving objects in unusual places?   denies   Wandering behavior? denies   Any personality changes ? denies   Any history of depression?: denies.   Hallucinations or paranoia?  Not frequently.  "She smelled  smoke in the middle of the night, a door bell ringing and even called the police for that. . In the past she [place] some [place] when she was taking care of her husband, but not recently.  Seizures? denies    Any sleep changes?  Denies  vivid dreams, REM behavior or sleepwalking   Sleep apnea? denies   Any hygiene concerns?  Less frequent than before.  Independent of bathing and dressing?  Endorsed  Does the patient need help with medications? Patient is in charge may be missing some doses. Who is in charge of the finances?  Family is in charge     Any changes in appetite?   denies     Patient have trouble swallowing?  denies   Does the patient cook? "When I have to".    Any kitchen accidents such as leaving the stove on? Patient denies   Any headaches?  denies   Chronic back pain?  denies   Ambulates with difficulty? denies   Recent falls or head injuries? denies     Vision changes? Unilateral weakness, numbness or tingling? denies   Any tremors?  denies   Any anosmia?  denies   Any incontinence of urine? denies   Any bowel dysfunction? denies      Patient lives alone since her husband's death.     History of heavy alcohol intake? denies   History of heavy tobacco use? denies   Family history of dementia?  Denies     Does patient drive? Endorsed, locally. Some family members help out as well.    Personally reviewed MRI of the brain from March 2024 is remarkable for advanced generalized volume loss with temporal lobe predominance, and moderate chronic microvascular ischemic disease  PREVIOUS MEDICATIONS:   CURRENT MEDICATIONS:  Outpatient Encounter Medications as of 03/18/2023  Medication Sig   Cyanocobalamin (VITAMIN B 12 PO) Take 1,000 mcg by mouth daily.    donepezil (ARICEPT) 5 MG tablet Take 5 mg by mouth at bedtime.   pyridoxine (B-6) 100 MG tablet Take 100 mg by mouth daily.   aspirin 81 MG tablet Take 81 mg by mouth daily. (Patient not taking: Reported on  01/03/2023)   Cholecalciferol (VITAMIN D PO) Take 1,000 Units by mouth daily.  (Patient not taking: Reported on 01/03/2023)   fexofenadine (ALLEGRA) 180 MG tablet Take by mouth as needed. allergies (Patient not taking: Reported on 03/18/2023)   metoprolol succinate (TOPROL-XL) 50 MG 24 hr tablet daily. (Patient not taking: Reported on 03/18/2023)   ondansetron (ZOFRAN ODT) 4 MG disintegrating tablet Take 1 tablet (4 mg total) by mouth every 8 (eight) hours as needed for nausea or vomiting. (Patient not taking: Reported on 03/01/2022)   OVER THE COUNTER MEDICATION Vitamin d 3 2 tabs daily (Patient not taking: Reported on 01/03/2023)   POTASSIUM BICARBONATE PO Take 550 mg by mouth daily. (  Patient not taking: Reported on 01/03/2023)   rosuvastatin (CRESTOR) 20 MG tablet Take 20 mg by mouth at bedtime. (Patient not taking: Reported on 01/03/2023)   telmisartan (MICARDIS) 80 MG tablet Take 80 mg by mouth daily. (Patient not taking: Reported on 03/18/2023)   vitamin C (ASCORBIC ACID) 500 MG tablet Take 500 mg by mouth daily. (Patient not taking: Reported on 01/03/2023)   No facility-administered encounter medications on file as of 03/18/2023.        No data to display            01/04/2023    2:00 PM 01/03/2023    5:00 PM  Montreal Cognitive Assessment   Visuospatial/ Executive (0/5) 4 4  Naming (0/3) 2 2  Attention: Read list of digits (0/2) 2 2  Attention: Read list of letters (0/1) 1 1  Attention: Serial 7 subtraction starting at 100 (0/3) 1 1  Language: Repeat phrase (0/2) 0 0  Language : Fluency (0/1) 0 0  Abstraction (0/2) 0 0  Delayed Recall (0/5) 0 0  Orientation (0/6) 3 3  Total 13 13  Adjusted Score (based on education)  13    Objective:     PHYSICAL EXAMINATION:    VITALS:   Vitals:   03/18/23 1132  BP: 127/77  Pulse: 77  Resp: 18  SpO2: 98%  Weight: 139 lb (63 kg)  Height: 5\' 6"  (1.676 m)    GEN:  The patient appears stated age and is in NAD. HEENT:  Normocephalic,  atraumatic.   Neurological examination:  General: NAD, well-groomed, appears stated age. Orientation: The patient is alert. Oriented to person, place and not to date. Cranial nerves: There is good facial symmetry.The speech is fluent and clear. No aphasia or dysarthria. Fund of knowledge is appropriate. Recent and remote memory are impaired. Attention and concentration are reduced.  Able to name objects and repeat phrases.  Hearing is intact to conversational tone.   Sensation: Sensation is intact to light touch throughout Motor: Strength is at least antigravity x4. DTR's 2/4 in UE/LE     Movement examination: Tone: There is normal tone in the UE/LE Abnormal movements:  no tremor.  No myoclonus.  No asterixis.   Coordination:  There is no decremation with RAM's. Normal finger to nose  Gait and Station: The patient has no difficulty arising out of a deep-seated chair without the use of the hands. The patient's stride length is good.  Gait is cautious and narrow.    Thank you for allowing [place] the opportunity to participate in the care of this nice patient. Please do not hesitate to contact us for any questions or concerns.   Total time spent on today's visit was 23 minutes dedicated to this patient today, preparing to see patient, examining the patient, ordering tests and/or medications and counseling the patient, documenting clinical information in the EHR or other health record, independently interpreting results and communicating results to the patient/family, discussing treatment and goals, answering patient's questions and coordinating care.  Cc:  Merri Brunette, MD  Marlowe Kays 03/18/2023 11:51 AM

## 2023-03-18 NOTE — Patient Instructions (Addendum)
It was a pleasure to see you today at our office.   Recommendations:  Follow up in 6 mo  Continue taking B12 and D  Continue Aricept 5 mg daily  You have been referred for a neuropsychological evaluation (i.e., evaluation of memory and thinking abilities).      For assessment of decision of mental capacity and competency:  Call Dr. Erick Blinks, geriatric psychiatrist at [phone]  Whom to call: Memory  decline, memory medications: Call our office [phone]  For psychiatric meds, mood meds: Please have your primary care physician manage these medications.  If you have any severe symptoms of a stroke, or other severe issues such as confusion,severe chills or fever, etc call 911 or go to the ER as you may need to be evaluated further      RECOMMENDATIONS FOR ALL PATIENTS WITH MEMORY PROBLEMS: 1. Continue to exercise (Recommend 30 minutes of walking everyday, or 3 hours every week) 2. Increase social interactions - continue going to [place] and enjoy social gatherings with friends and family 3. Eat healthy, avoid fried foods and eat more fruits and vegetables 4. Maintain adequate blood pressure, blood sugar, and blood cholesterol level. Reducing the risk of stroke and cardiovascular disease also helps promoting better memory. 5. Avoid stressful situations. Live a simple life and avoid aggravations. Organize your time and prepare for the next day in anticipation. 6. Sleep well, avoid any interruptions of sleep and avoid any distractions in the bedroom that may interfere with adequate sleep quality 7. Avoid sugar, avoid sweets as there is a strong link between excessive sugar intake, diabetes, and cognitive impairment We discussed the Mediterranean diet, which has been shown to help patients reduce the risk of progressive memory disorders and reduces cardiovascular risk. This includes eating fish, eat fruits and green leafy vegetables, nuts like almonds and hazelnuts, walnuts, and also  use olive oil. Avoid fast foods and fried foods as much as possible. Avoid sweets and sugar as sugar use has been linked to worsening of memory function.  There is always a concern of gradual progression of memory problems. If this is the case, then we may need to adjust level of care according to patient needs. Support, both to the patient and caregiver, should then be put into place.    The Alzheimer[employer] Association is here all day, every day for people facing Alzheimer's disease through our free 24/7 Helpline: [phone])[phone]. The Helpline provides reliable information and support to all those who need assistance, such as individuals living with memory loss, Alzheimer's or other dementia, caregivers, health care professionals and the public.  Our highly trained and knowledgeable staff can help you with: Understanding memory loss, dementia and Alzheimer's  Medications and other treatment options  General information about aging and brain health  Skills to provide quality care and to find the best care from professionals  Legal, financial and living-arrangement decisions Our Helpline also features: Confidential care consultation provided by master's level clinicians who can help with decision-making support, crisis assistance and education on issues families face every day  Help in a caller's preferred language using our translation service that features more than 200 languages and dialects  Referrals to local community programs, services and ongoing support     FALL PRECAUTIONS: Be cautious when walking. Scan the area for obstacles that may increase the risk of trips and falls. When getting up in the mornings, sit up at the edge of the bed for a few minutes before getting out of  bed. Consider elevating the bed at the head end to avoid drop of blood pressure when getting up. Walk always in a well-lit room (use night lights in the walls). Avoid area rugs or power cords from appliances in the middle of  the walkways. Use a walker or a cane if necessary and consider physical therapy for balance exercise. Get your eyesight checked regularly.  FINANCIAL OVERSIGHT: Supervision, especially oversight when making financial decisions or transactions is also recommended.  HOME SAFETY: Consider the safety of the kitchen when operating appliances like stoves, microwave oven, and blender. Consider having supervision and share cooking responsibilities until no longer able to participate in those. Accidents with firearms and other hazards in the house should be identified and addressed as well.   ABILITY TO BE LEFT ALONE: If patient is unable to contact 911 operator, consider using LifeLine, or when the need is there, arrange for someone to stay with patients. Smoking is a fire hazard, consider supervision or cessation. Risk of wandering should be assessed by caregiver and if detected at any point, supervision and safe proof recommendations should be instituted.  MEDICATION SUPERVISION: Inability to self-administer medication needs to be constantly addressed. Implement a mechanism to ensure safe administration of the medications.   DRIVING: Regarding driving, in patients with progressive memory problems, driving will be impaired. We advise to have someone else do the driving if trouble finding directions or if minor accidents are reported. Independent driving assessment is available to determine safety of driving.   If you are interested in the driving assessment, you can contact the following:  [employer] in [place] [phone]  [facility] [phone]  [facility] ([facility]) [phone] or [phone]      Mediterranean Diet A Mediterranean diet refers to food and lifestyle choices that are based on the traditions of countries located on the [employer]. This way of eating has been shown to help prevent certain conditions  and improve outcomes for people who have chronic diseases, like kidney disease and heart disease. What are tips for following this plan? Lifestyle  Cook and eat meals together with your family, when possible. Drink enough fluid to keep your urine clear or pale yellow. Be physically active every day. This includes: Aerobic exercise like running or swimming. Leisure activities like gardening, walking, or housework. Get 7-8 hours of sleep each night. If recommended by your health care provider, drink red wine in moderation. This means 1 glass a day for nonpregnant women and 2 glasses a day for men. A glass of wine equals 5 oz (150 mL). Reading food labels  Check the serving size of packaged foods. For foods such as rice and pasta, the serving size refers to the amount of cooked product, not dry. Check the total fat in packaged foods. Avoid foods that have saturated fat or trans fats. Check the ingredients list for added sugars, such as corn syrup. Shopping  At the grocery store, buy most of your food from the areas near the walls of the store. This includes: Fresh fruits and vegetables (produce). Grains, beans, nuts, and seeds. Some of these may be available in unpackaged forms or large amounts (in bulk). Fresh seafood. Poultry and eggs. Low-fat dairy products. Buy whole ingredients instead of prepackaged foods. Buy fresh fruits and vegetables in-season from local farmers markets. Buy frozen fruits and vegetables in resealable bags. If you do not have access to quality fresh seafood, buy precooked frozen shrimp or canned fish,  such as tuna, salmon, or sardines. Buy small amounts of raw or cooked vegetables, salads, or olives from the deli or salad bar at your store. Stock your pantry so you always have certain foods on hand, such as olive oil, canned tuna, canned tomatoes, rice, pasta, and beans. Cooking  Cook foods with extra-virgin olive oil instead of using butter or other vegetable  oils. Have meat as a side dish, and have vegetables or grains as your main dish. This means having meat in small portions or adding small amounts of meat to foods like pasta or stew. Use beans or vegetables instead of meat in common dishes like chili or lasagna. Experiment with different cooking methods. Try roasting or broiling vegetables instead of steaming or sauteing them. Add frozen vegetables to soups, stews, pasta, or rice. Add nuts or seeds for added healthy fat at each meal. You can add these to yogurt, salads, or vegetable dishes. Marinate fish or vegetables using olive oil, lemon juice, garlic, and fresh herbs. Meal planning  Plan to eat 1 vegetarian meal one day each week. Try to work up to 2 vegetarian meals, if possible. Eat seafood 2 or more times a week. Have healthy snacks readily available, such as: Vegetable sticks with hummus. Greek yogurt. Fruit and nut trail mix. Eat balanced meals throughout the week. This includes: Fruit: 2-3 servings a day Vegetables: 4-5 servings a day Low-fat dairy: 2 servings a day Fish, poultry, or lean meat: 1 serving a day Beans and legumes: 2 or more servings a week Nuts and seeds: 1-2 servings a day Whole grains: 6-8 servings a day Extra-virgin olive oil: 3-4 servings a day Limit red meat and sweets to only a few servings a month What are my food choices? Mediterranean diet Recommended Grains: Whole-grain pasta. Brown rice. Bulgar wheat. Polenta. Couscous. Whole-wheat bread. Orpah Cobb. Vegetables: Artichokes. Beets. Broccoli. Cabbage. Carrots. Eggplant. Green beans. Chard. Kale. Spinach. Onions. Leeks. Peas. Squash. Tomatoes. Peppers. Radishes. Fruits: Apples. Apricots. Avocado. Berries. Bananas. Cherries. Dates. Figs. Grapes. Lemons. Melon. Oranges. Peaches. Plums. Pomegranate. Meats and other protein foods: Beans. Almonds. Sunflower seeds. Pine nuts. Peanuts. Cod. Salmon. Scallops. Shrimp. Tuna. Tilapia. Clams. Oysters.  Eggs. Dairy: Low-fat milk. Cheese. Greek yogurt. Beverages: Water. Red wine. Herbal tea. Fats and oils: Extra virgin olive oil. Avocado oil. Grape seed oil. Sweets and desserts: [place] yogurt with honey. Baked apples. Poached pears. Trail mix. Seasoning and other foods: Basil. Cilantro. Coriander. Cumin. Mint. Parsley. Sage. Rosemary. Tarragon. Garlic. Oregano. Thyme. Pepper. Balsalmic vinegar. Tahini. Hummus. Tomato sauce. Olives. Mushrooms. Limit these Grains: Prepackaged pasta or rice dishes. Prepackaged cereal with added sugar. Vegetables: Deep fried potatoes (french fries). Fruits: Fruit canned in syrup. Meats and other protein foods: Beef. Pork. Lamb. Poultry with skin. Hot dogs. Tomasa Blase. Dairy: Ice cream. Sour cream. Whole milk. Beverages: Juice. Sugar-sweetened soft drinks. Beer. Liquor and spirits. Fats and oils: Butter. Canola oil. Vegetable oil. Beef fat (tallow). Lard. Sweets and desserts: Cookies. Cakes. Pies. Candy. Seasoning and other foods: Mayonnaise. Premade sauces and marinades. The items listed may not be a complete list. Talk with your dietitian about what dietary choices are right for you. Summary The Mediterranean diet includes both food and lifestyle choices. Eat a variety of fresh fruits and vegetables, beans, nuts, seeds, and whole grains. Limit the amount of red meat and sweets that you eat. Talk with your health care provider about whether it is safe for you to drink red wine in moderation. This means 1 glass a day for  nonpregnant women and 2 glasses a day for men. A glass of wine equals 5 oz (150 mL). This information is not intended to replace advice given to you by your health care provider. Make sure you discuss any questions you have with your health care provider. Document Released: 03/25/2016 Document Revised: 04/27/2016 Document Reviewed: 03/25/2016 Elsevier Interactive Patient Education  2017 [employer].

## 2023-04-04 ENCOUNTER — Ambulatory Visit: Payer: Medicare HMO | Attending: Cardiovascular Disease | Admitting: Cardiovascular Disease

## 2023-04-04 ENCOUNTER — Encounter: Payer: Self-pay | Admitting: Cardiovascular Disease

## 2023-04-04 VITALS — BP 120/76 | HR 54 | Ht 66.5 in | Wt 139.6 lb

## 2023-04-04 DIAGNOSIS — I10 Essential (primary) hypertension: Secondary | ICD-10-CM | POA: Diagnosis not present

## 2023-04-04 DIAGNOSIS — I2510 Atherosclerotic heart disease of native coronary artery without angina pectoris: Secondary | ICD-10-CM

## 2023-04-04 NOTE — Patient Instructions (Signed)

## 2023-04-04 NOTE — Progress Notes (Signed)
Cardiology Office Note:    Date:  04/04/2023   ID:  [name], DOB Oct 20, 1945, MRN [id number]  PCP:  Merri Brunette, MD   [facility] Providers Cardiologist:  Tonny Bollman, MD     Referring MD: Merri Brunette, MD   Chief Complaint  Patient presents with   Follow-up    Hypertension    History of Present Illness:    Bianca [name] is a 77 y.o. female with a hx of coronary artery disease and hyperlipidemia. The patient was noted to have aortic atherosclerosis and three-vessel CAD based on a noncardiac CT scan several years ago. She has been treated with risk factor modification using aspirin for antiplatelet therapy, beta-blocker, and statin drug. Since her last visit she has developed progressive problems with dementia. She is accompanied by a family member today who provides most of her history. Today, she denies symptoms of palpitations, chest pain, shortness of breath, orthopnea, PND, lower extremity edema, dizziness, or syncope. 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 She has quit taking almost all of her previous medications.    Past Medical History:  Diagnosis Date   Arthritis    Coronary artery disease    Elevated cholesterol    Hypertension    ILD (interstitial lung disease) (HCC)    mild ild per lov dr Vassie Loll pumonary 10-21-2017   Left wrist fracture 05/04/2021   in sling   Memory problem 05/06/2021   no diagnosis signs owns consent sister in law peggy Katrinka Blazing helps due to spouse recent tonue surgery no formal poa   Poor historian 05/06/2021   no diagnosis but sister in law peggy Katrinka Blazing helps due to spouse recent tongue surgery    Past Surgical History:  Procedure Laterality Date   ABDOMINAL SURGERY  1988   Exp. Laparotomy -Micro tuboplasty   ANTERIOR CRUCIATE LIGAMENT REPAIR     COSMETIC SURGERY     HARDWARE REMOVAL Left 06/04/2021   Procedure: HARDWARE REMOVAL;  Surgeon: Gomez Cleverly, MD;  Location: [facility];  Service: Orthopedics;  Laterality: Left;  9 screws and 1 plate   hemorrdectomy     NASAL SINUS SURGERY     OOPHORECTOMY  2009   BSO cystadenofibroma   ORIF WRIST FRACTURE Left 05/07/2021   Procedure: OPEN REDUCTION INTERNAL FIXATION (ORIF) WRIST FRACTURE;  Surgeon: Gomez Cleverly, MD;  Location: [facility];  Service: Orthopedics;  Laterality: Left;  with MAC   PELVIC LAPAROSCOPY     DL with tubal lavage    Current Medications: Current Meds  Medication Sig   aspirin 81 MG tablet Take 81 mg by mouth as needed.   Cholecalciferol (VITAMIN D PO) Take 1,000 Units by mouth daily.   Cyanocobalamin (VITAMIN B 12 PO) Take 1,000 mcg by mouth daily.    donepezil (ARICEPT) 5 MG tablet Take 5 mg by mouth at bedtime.   pyridoxine (B-6) 100 MG tablet Take 100 mg by mouth daily.   telmisartan (MICARDIS) 80 MG tablet Take 80 mg by mouth daily.   [DISCONTINUED] fexofenadine (ALLEGRA) 180 MG tablet Take by mouth as needed. allergies   [DISCONTINUED] metoprolol succinate (TOPROL-XL) 50 MG 24 hr tablet daily.   [DISCONTINUED] ondansetron (ZOFRAN ODT) 4 MG disintegrating tablet Take 1 tablet (4 mg total) by mouth every 8 (eight) hours as needed for nausea or vomiting.   [DISCONTINUED] OVER THE COUNTER MEDICATION Vitamin d 3 2 tabs daily   [DISCONTINUED] POTASSIUM BICARBONATE PO  Take 550 mg by mouth daily.   [DISCONTINUED] rosuvastatin (CRESTOR) 20 MG tablet Take 20 mg by mouth at bedtime.   [DISCONTINUED] vitamin C (ASCORBIC ACID) 500 MG tablet Take 500 mg by mouth daily.     Allergies:   Patient has no known allergies.   Social History   Socioeconomic History   Marital status: Married    Spouse name: Not on file   Number of children: 0   Years of education: 13   Highest education level: Not on file  Occupational History   Not on file  Tobacco Use   Smoking status: Former    Types: Cigarettes   Smokeless tobacco: Never   Tobacco comments:     Smoking cigarettes  Quit age 17 smoked age 49 to 23 1 ppd per pt  Vaping Use   Vaping status: Never Used  Substance and Sexual Activity   Alcohol use: Yes    Alcohol/week: 0.0 standard drinks of alcohol    Comment: Rare   Drug use: No   Sexual activity: Never    Birth control/protection: Post-menopausal    Comment: 1st intercourse 77 yo-Fewer than 5 partners  Other Topics Concern   Not on file  Social History Narrative   Are you right handed or left handed? left   Are you currently employed ?    What is your current occupation? retire   Do you live at home alone?yes   Who lives with you?    What type of home do you live in: 1 story or 2 story? two   Caffeine 1 cup in am and 1 soda    Social Determinants of Health   Financial Resource Strain: Not on file  Food Insecurity: Not on file  Transportation Needs: Not on file  Physical Activity: Not on file  Stress: Not on file  Social Connections: Not on file     Family History: The patient's family history includes Heart disease in her father and paternal grandfather; Hypertension in her father; Pancreatic cancer in her mother; Stroke in her paternal grandfather.  ROS:   Please see the history of present illness.    All other systems reviewed and are negative.  EKGs/Labs/Other Studies Reviewed:    The following studies were reviewed today: EKG Interpretation Date/Time:  Monday April 04 2023 15:50:40 EDT Ventricular Rate:  54 PR Interval:  166 QRS Duration:  92 QT Interval:  442 QTC Calculation: 419 R Axis:   54  Text Interpretation: Sinus bradycardia When compared with ECG of 07-May-2021 09:39, No significant change was found Confirmed by Tonny Bollman [phone]) on 04/04/2023 4:06:48 PM    Recent Labs: 01/03/2023: TSH 1.37  Recent Lipid Panel    Component Value Date/Time   CHOL 149 03/03/2022 1339   TRIG 82 03/03/2022 1339   HDL 77 03/03/2022 1339   CHOLHDL 1.9 03/03/2022 1339   CHOLHDL 2.3 12/06/2010 0840   VLDL 17 12/06/2010 0840    LDLCALC 57 03/03/2022 1339     Risk Assessment/Calculations:                Physical Exam:    VS:  BP 120/76   Pulse (!) 54   Ht 5' 6.5" (1.689 m)   Wt 139 lb 9.6 oz (63.3 kg)   SpO2 99%   BMI 22.19 kg/m     Wt Readings from Last 3 Encounters:  04/04/23 139 lb 9.6 oz (63.3 kg)  03/18/23 139 lb (63 kg)  01/03/23 144 lb (65.3  kg)     GEN:  Well nourished, well developed in no acute distress HEENT: Normal NECK: No JVD; No carotid bruits LYMPHATICS: No lymphadenopathy CARDIAC: RRR, no murmurs, rubs, gallops RESPIRATORY:  Clear to auscultation without rales, wheezing or rhonchi  ABDOMEN: Soft, non-tender, non-distended MUSCULOSKELETAL:  No edema; No deformity  SKIN: Warm and dry NEUROLOGIC:  Alert and oriented x 3 PSYCHIATRIC:  Normal affect   ASSESSMENT:    1. Essential hypertension   2. Coronary artery disease involving native coronary artery of native heart without angina pectoris    PLAN:    In order of problems listed above:  BP controlled off of all medication No angina. Discussed medical therapy. In light of her dementia and reported hx of medication side effects, she and her family favor observation and no further cardiac medications.   I will plan to see her back in one year as needed.            Medication Adjustments/Labs and Tests Ordered: Current medicines are reviewed at length with the patient today.  Concerns regarding medicines are outlined above.  Orders Placed This Encounter  Procedures   EKG 12-Lead   No orders of the defined types were placed in this encounter.   Patient Instructions  Medication Instructions:  Your physician recommends that you continue on your current medications as directed. Please refer to the Current Medication list given to you today.  *If you need a refill on your cardiac medications before your next appointment, please call your pharmacy*   Lab Work: NONE If you have labs (blood work) drawn today  and your tests are completely normal, you will receive your results only by: MyChart Message (if you have MyChart) OR A paper copy in the mail If you have any lab test that is abnormal or we need to change your treatment, we will call you to review the results.   Testing/Procedures: NONE   Follow-Up: At [facility], you and your health needs are our priority.  As part of our continuing mission to provide you with exceptional heart care, we have created designated Provider Care Teams.  These Care Teams include your primary Cardiologist (physician) and Advanced Practice Providers (APPs -  Physician Assistants and Nurse Practitioners) who all work together to provide you with the care you need, when you need it.  We recommend signing up for the patient portal called "MyChart".  Sign up information is provided on this After Visit Summary.  MyChart is used to connect with patients for Virtual Visits (Telemedicine).  Patients are able to view lab/test results, encounter notes, upcoming appointments, etc.  Non-urgent messages can be sent to your provider as well.   To learn more about what you can do with MyChart, go to [email or web address].    Your next appointment:   1 year(s)  Provider:   Tonny Bollman, MD        Signed, Tonny Bollman, MD  04/04/2023 5:21 PM    [facility]

## 2023-09-26 ENCOUNTER — Ambulatory Visit: Payer: Medicare HMO | Admitting: Physician Assistant

## 2023-09-30 ENCOUNTER — Encounter: Payer: Self-pay | Admitting: Physician Assistant

## 2023-11-10 ENCOUNTER — Ambulatory Visit: Payer: Self-pay

## 2023-11-10 ENCOUNTER — Institutional Professional Consult (permissible substitution): Payer: Medicare HMO | Admitting: Psychology

## 2023-11-15 ENCOUNTER — Ambulatory Visit: Payer: Medicare HMO | Admitting: Physician Assistant

## 2023-11-24 ENCOUNTER — Encounter: Payer: Self-pay | Admitting: Physician Assistant

## 2023-11-24 ENCOUNTER — Ambulatory Visit (INDEPENDENT_AMBULATORY_CARE_PROVIDER_SITE_OTHER): Admitting: Physician Assistant

## 2023-11-24 VITALS — BP 110/80 | HR 78 | Resp 20 | Ht 66.5 in

## 2023-11-24 DIAGNOSIS — G309 Alzheimer's disease, unspecified: Secondary | ICD-10-CM

## 2023-11-24 DIAGNOSIS — F0280 Dementia in other diseases classified elsewhere without behavioral disturbance: Secondary | ICD-10-CM | POA: Diagnosis not present

## 2023-11-24 MED ORDER — MEMANTINE HCL 5 MG PO TABS: ORAL_TABLET | ORAL | 11 refills | Status: DC

## 2023-11-24 NOTE — Patient Instructions (Signed)
 It was a pleasure to see you today at our office.   Recommendations:  Follow up in 6 mo  Continue taking B12 and D  Continue Aricept 5 mg daily   Start Memantine 5 mg: Take 1 tablet (5 mg at night) for 2 weeks, then increase to 1 tablet (5 mg) twice a day         For assessment of decision of mental capacity and competency:  Call Dr. Erick Blinks, geriatric psychiatrist at [phone]  Whom to call: Memory  decline, memory medications: Call our office [phone]  For psychiatric meds, mood meds: Please have your primary care physician manage these medications.  If you have any severe symptoms of a stroke, or other severe issues such as confusion,severe chills or fever, etc call 911 or go to the ER as you may need to be evaluated further      RECOMMENDATIONS FOR ALL PATIENTS WITH MEMORY PROBLEMS: 1. Continue to exercise (Recommend 30 minutes of walking everyday, or 3 hours every week) 2. Increase social interactions - continue going to [place] and enjoy social gatherings with friends and family 3. Eat healthy, avoid fried foods and eat more fruits and vegetables 4. Maintain adequate blood pressure, blood sugar, and blood cholesterol level. Reducing the risk of stroke and cardiovascular disease also helps promoting better memory. 5. Avoid stressful situations. Live a simple life and avoid aggravations. Organize your time and prepare for the next day in anticipation. 6. Sleep well, avoid any interruptions of sleep and avoid any distractions in the bedroom that may interfere with adequate sleep quality 7. Avoid sugar, avoid sweets as there is a strong link between excessive sugar intake, diabetes, and cognitive impairment We discussed the Mediterranean diet, which has been shown to help patients reduce the risk of progressive memory disorders and reduces cardiovascular risk. This includes eating fish, eat fruits and green leafy vegetables, nuts like almonds and hazelnuts, walnuts, and  also use olive oil. Avoid fast foods and fried foods as much as possible. Avoid sweets and sugar as sugar use has been linked to worsening of memory function.  There is always a concern of gradual progression of memory problems. If this is the case, then we may need to adjust level of care according to patient needs. Support, both to the patient and caregiver, should then be put into place.    The Alzheimer's Association is here all day, every day for people facing Alzheimer's disease through our free 24/7 Helpline: [phone]. The Helpline provides reliable information and support to all those who need assistance, such as individuals living with memory loss, Alzheimer's or other dementia, caregivers, health care professionals and the public.  Our highly trained and knowledgeable staff can help you with: Understanding memory loss, dementia and Alzheimer's  Medications and other treatment options  General information about aging and brain health  Skills to provide quality care and to find the best care from professionals  Legal, financial and living-arrangement decisions Our Helpline also features: Confidential care consultation provided by master's level clinicians who can help with decision-making support, crisis assistance and education on issues families face every day  Help in a caller's preferred language using our translation service that features more than 200 languages and dialects  Referrals to local community programs, services and ongoing support     FALL PRECAUTIONS: Be cautious when walking. Scan the area for obstacles that may increase the risk of trips and falls. When getting up in the mornings, sit up at  the edge of the bed for a few minutes before getting out of bed. Consider elevating the bed at the head end to avoid drop of blood pressure when getting up. Walk always in a well-lit room (use night lights in the walls). Avoid area rugs or power cords from appliances in the  middle of the walkways. Use a walker or a cane if necessary and consider physical therapy for balance exercise. Get your eyesight checked regularly.  FINANCIAL OVERSIGHT: Supervision, especially oversight when making financial decisions or transactions is also recommended.  HOME SAFETY: Consider the safety of the kitchen when operating appliances like stoves, microwave oven, and blender. Consider having supervision and share cooking responsibilities until no longer able to participate in those. Accidents with firearms and other hazards in the house should be identified and addressed as well.   ABILITY TO BE LEFT ALONE: If patient is unable to contact 911 operator, consider using LifeLine, or when the need is there, arrange for someone to stay with patients. Smoking is a fire hazard, consider supervision or cessation. Risk of wandering should be assessed by caregiver and if detected at any point, supervision and safe proof recommendations should be instituted.  MEDICATION SUPERVISION: Inability to self-administer medication needs to be constantly addressed. Implement a mechanism to ensure safe administration of the medications.   DRIVING: Regarding driving, in patients with progressive memory problems, driving will be impaired. We advise to have someone else do the driving if trouble finding directions or if minor accidents are reported. Independent driving assessment is available to determine safety of driving.   If you are interested in the driving assessment, you can contact the following:  [facility] [employer] [facility] in [place] [phone]  [facility] [phone]  [facility] [phone] or [phone]      Mediterranean Diet A Mediterranean diet refers to food and lifestyle choices that are based on the traditions of countries located on the [employer]. This way of eating has been shown to help prevent certain  conditions and improve outcomes for people who have chronic diseases, like kidney disease and heart disease. What are tips for following this plan? Lifestyle  Cook and eat meals together with your family, when possible. Drink enough fluid to keep your urine clear or pale yellow. Be physically active every day. This includes: Aerobic exercise like running or swimming. Leisure activities like gardening, walking, or housework. Get 7-8 hours of sleep each night. If recommended by your health care provider, drink red wine in moderation. This means 1 glass a day for nonpregnant women and 2 glasses a day for men. A glass of wine equals 5 oz (150 mL). Reading food labels  Check the serving size of packaged foods. For foods such as rice and pasta, the serving size refers to the amount of cooked product, not dry. Check the total fat in packaged foods. Avoid foods that have saturated fat or trans fats. Check the ingredients list for added sugars, such as corn syrup. Shopping  At the grocery store, buy most of your food from the areas near the walls of the store. This includes: Fresh fruits and vegetables (produce). Grains, beans, nuts, and seeds. Some of these may be available in unpackaged forms or large amounts (in bulk). Fresh seafood. Poultry and eggs. Low-fat dairy products. Buy whole ingredients instead of prepackaged foods. Buy fresh fruits and vegetables in-season from local farmers markets. Buy frozen fruits and vegetables in resealable bags. If you do not  have access to quality fresh seafood, buy precooked frozen shrimp or canned fish, such as tuna, salmon, or sardines. Buy small amounts of raw or cooked vegetables, salads, or olives from the deli or salad bar at your store. Stock your pantry so you always have certain foods on hand, such as olive oil, canned tuna, canned tomatoes, rice, pasta, and beans. Cooking  Cook foods with extra-virgin olive oil instead of using butter or other  vegetable oils. Have meat as a side dish, and have vegetables or grains as your main dish. This means having meat in small portions or adding small amounts of meat to foods like pasta or stew. Use beans or vegetables instead of meat in common dishes like chili or lasagna. Experiment with different cooking methods. Try roasting or broiling vegetables instead of steaming or sauteing them. Add frozen vegetables to soups, stews, pasta, or rice. Add nuts or seeds for added healthy fat at each meal. You can add these to yogurt, salads, or vegetable dishes. Marinate fish or vegetables using olive oil, lemon juice, garlic, and fresh herbs. Meal planning  Plan to eat 1 vegetarian meal one day each week. Try to work up to 2 vegetarian meals, if possible. Eat seafood 2 or more times a week. Have healthy snacks readily available, such as: Vegetable sticks with hummus. Greek yogurt. Fruit and nut trail mix. Eat balanced meals throughout the week. This includes: Fruit: 2-3 servings a day Vegetables: 4-5 servings a day Low-fat dairy: 2 servings a day Fish, poultry, or lean meat: 1 serving a day Beans and legumes: 2 or more servings a week Nuts and seeds: 1-2 servings a day Whole grains: 6-8 servings a day Extra-virgin olive oil: 3-4 servings a day Limit red meat and sweets to only a few servings a month What are my food choices? Mediterranean diet Recommended Grains: Whole-grain pasta. Brown rice. Bulgar wheat. Polenta. Couscous. Whole-wheat bread. Orpah Cobb. Vegetables: Artichokes. Beets. Broccoli. Cabbage. Carrots. Eggplant. Green beans. Chard. Kale. Spinach. Onions. Leeks. Peas. Squash. Tomatoes. Peppers. Radishes. Fruits: Apples. Apricots. Avocado. Berries. Bananas. Cherries. Dates. Figs. Grapes. Lemons. Melon. Oranges. Peaches. Plums. Pomegranate. Meats and other protein foods: Beans. Almonds. Sunflower seeds. Pine nuts. Peanuts. Cod. Salmon. Scallops. Shrimp. Tuna. Tilapia. Clams.  Oysters. Eggs. Dairy: Low-fat milk. Cheese. Greek yogurt. Beverages: Water. Red wine. Herbal tea. Fats and oils: Extra virgin olive oil. Avocado oil. Grape seed oil. Sweets and desserts: [place] yogurt with honey. Baked apples. Poached pears. Trail mix. Seasoning and other foods: Basil. Cilantro. Coriander. Cumin. Mint. Parsley. Sage. Rosemary. Tarragon. Garlic. Oregano. Thyme. Pepper. Balsalmic vinegar. Tahini. Hummus. Tomato sauce. Olives. Mushrooms. Limit these Grains: Prepackaged pasta or rice dishes. Prepackaged cereal with added sugar. Vegetables: Deep fried potatoes (french fries). Fruits: Fruit canned in syrup. Meats and other protein foods: Beef. Pork. Lamb. Poultry with skin. Hot dogs. Tomasa Blase. Dairy: Ice cream. Sour cream. Whole milk. Beverages: Juice. Sugar-sweetened soft drinks. Beer. Liquor and spirits. Fats and oils: Butter. Canola oil. Vegetable oil. Beef fat (tallow). Lard. Sweets and desserts: Cookies. Cakes. Pies. Candy. Seasoning and other foods: Mayonnaise. Premade sauces and marinades. The items listed may not be a complete list. Talk with your dietitian about what dietary choices are right for you. Summary The Mediterranean diet includes both food and lifestyle choices. Eat a variety of fresh fruits and vegetables, beans, nuts, seeds, and whole grains. Limit the amount of red meat and sweets that you eat. Talk with your health care provider about whether it is safe for you  to drink red wine in moderation. This means 1 glass a day for nonpregnant women and 2 glasses a day for men. A glass of wine equals 5 oz (150 mL). This information is not intended to replace advice given to you by your health care provider. Make sure you discuss any questions you have with your health care provider. Document Released: 03/25/2016 Document Revised: 04/27/2016 Document Reviewed: 03/25/2016 Elsevier Interactive Patient Education  2017 [employer].

## 2023-11-24 NOTE — Progress Notes (Signed)
 Assessment/Plan:   Dementia likely due to Alzheimer's disease  Bianca [name] is a very pleasant 78 y.o. RH female with a history of  hypertension, hyperlipidemia, chronic back and neck pain, IBS, history multinodular goiter and dementia likely due to Alzheimer's disease seen today in follow up for memory loss. Patient is currently on donepezil 5 mg daily (GI symptoms to 10 mg).  During the last visit, we discussed either changing the medication to rivastigmine patches for better coverage and compliance, or adding memantine but she declined any changes at the time.  Cognitive decline in today's visit is noted, discussed initiating memantine in an effort to slow down any further memory changes, patient agrees to proceed.  Mood is good.  She is still able to participate in some activities, but needs more assistance than prior.    Follow up in 6 months. Continue donepezil 5 mg daily, side effects discussed Add memantine 5 mg twice daily as directed, side effects discussed.  If tolerated, the goal is to increase it to 10 mg twice daily for better coverage. Recommend good control of her cardiovascular risk factors Continue to control mood as per PCP     Subjective:    This patient is accompanied in the office by her family member  who supplements the history.  Previous records as well as any outside records available were reviewed prior to todays visit. Patient was last seen on 8-24 with last MoCA 13/30    Any changes in memory since last visit? ".  LTM is good.  She is very active, enjoys reading, planting flowers in her "[address] ".  She uses sticky notes to remember.  She has some friends that visit her and keeps her visit.  She continues to have difficulty remembering new information or conversations.  For example, asking if her brother-in-law has died even though he brings her in the name of A-fib Friday night each week.  She also does not recognize her late husband who died in  01-09-24and a picture from 2016. repeats oneself?  Endorsed by her niece, she states that she has repeated questions up to 5 times within 1 hour. Disoriented when walking into a room?  Patient denies, but her knees states that she could not recall what the guest room in her house of 15 years look like. Leaving objects?  May misplace things but not in unusual places   Wandering behavior?  denies   Any personality changes since last visit?  denies   Any worsening depression?:  Denies.   Hallucinations or paranoia?  No hallucinations, but she does hide help he is not checkbook and is afraid that someone (caregivers) is going to steal from her. Seizures? denies    Any sleep changes?  Sleeps well.  Denies vivid dreams, REM behavior or sleepwalking   Sleep apnea?   Denies.   Any hygiene concerns?  She showers less frequently than before, she is resistance to it.  She reports wearing the same clothes after bathing. Independent of bathing and dressing?  Endorsed  Does the patient needs help with medications?  Patient  is in charge by using the pillbox  who is in charge of the finances?  Niece in charge     Any changes in appetite?  denies, she may eat and 2 hours later it again because she forgot that she did.    Patient have trouble swallowing? Denies.   Does the patient cook? No Any headaches?  denies   Chronic back pain  denies   Ambulates with difficulty? Denies, but her niece has noticed that she is shuffling more.   Recent falls or head injuries? denies     Unilateral weakness, numbness or tingling? Denies.   Any tremors?  Denies   Any anosmia?  Denies   Any incontinence of urine?  Endorsed, as of yesterday. Any bowel dysfunction?   Denies      Patient lives alone after her husband's death  Does the patient drive? No longer drives    Initial visit 01/03/2023 How long did patient have memory difficulties?  Her husband died of cancer in Aug 03, 2022. She began showing some cognitive  decline around that time, therefore her family took her to her PCP. He suspected dementia with a situational depression component . He placed her on Aricept 5 mg daily, but lately she was not compliant.  Patient has some difficulty remembering recent conversations and people names, she needs more time to retrieve the information. LTM is good. She is very active, enjoys weaving, planting flowers in her [place]" repeats oneself?  Endorsed by her niece. Usually about subjects that involve her family Disoriented when walking into a room?  Patient denies  Leaving objects in unusual places?   denies   Wandering behavior? denies   Any personality changes ? denies   Any history of depression?: denies.   Hallucinations or paranoia?  Not frequently.  "She smelled smoke in the middle of the night, a door bell ringing and even called the police for that. . In the past she saw some lights [place] when she was taking care of her husband, but not recently.  Seizures? denies    Any sleep changes?  Denies  vivid dreams, REM behavior or sleepwalking   Sleep apnea? denies   Any hygiene concerns?  Less frequent than before.  Independent of bathing and dressing?  Endorsed  Does the patient need help with medications? Patient is in charge may be missing some doses. Who is in charge of the finances?  Family is in charge     Any changes in appetite?   denies     Patient have trouble swallowing?  denies   Does the patient cook? "When I have to".    Any kitchen accidents such as leaving the stove on? Patient denies   Any headaches?  denies   Chronic back pain?  denies   Ambulates with difficulty? denies   Recent falls or head injuries? denies     Vision changes? Unilateral weakness, numbness or tingling? denies   Any tremors?  denies   Any anosmia?  denies   Any incontinence of urine? denies   Any bowel dysfunction? denies      Patient lives alone since her husband's death.     History  of heavy alcohol intake? denies   History of heavy tobacco use? denies   Family history of dementia?  Denies     Does patient drive? Endorsed, locally. Some family members help out as well.     Personally reviewed MRI of the brain from March 2024 is remarkable for advanced generalized volume loss with temporal lobe predominance, and moderate chronic microvascular ischemic disease   PREVIOUS MEDICATIONS: Donepezil 10 mg daily (GI)  CURRENT MEDICATIONS:  Outpatient Encounter Medications as of 11/24/2023  Medication Sig   aspirin 81 MG tablet Take 81 mg by mouth as needed.   Cholecalciferol (VITAMIN D PO)  Take 1,000 Units by mouth daily.   Cyanocobalamin (VITAMIN B 12 PO) Take 1,000 mcg by mouth daily.    donepezil (ARICEPT) 5 MG tablet Take 5 mg by mouth at bedtime.   memantine (NAMENDA) 5 MG tablet Take 1 tablet (5 mg at night) for 2 weeks, then increase to 1 tablet (5 mg) twice a day   pyridoxine (B-6) 100 MG tablet Take 100 mg by mouth daily.   telmisartan (MICARDIS) 80 MG tablet Take 80 mg by mouth daily.   No facility-administered encounter medications on file as of 11/24/2023.        No data to display            01/04/2023    2:00 PM 01/03/2023    5:00 PM  Montreal Cognitive Assessment   Visuospatial/ Executive (0/5) 4 4  Naming (0/3) 2 2  Attention: Read list of digits (0/2) 2 2  Attention: Read list of letters (0/1) 1 1  Attention: Serial 7 subtraction starting at 100 (0/3) 1 1  Language: Repeat phrase (0/2) 0 0  Language : Fluency (0/1) 0 0  Abstraction (0/2) 0 0  Delayed Recall (0/5) 0 0  Orientation (0/6) 3 3  Total 13 13  Adjusted Score (based on education)  13    Objective:     PHYSICAL EXAMINATION:    VITALS:   Vitals:   11/24/23 1424  BP: 110/80  Pulse: 78  Resp: 20  Height: 5' 6.5" (1.689 m)    GEN:  The patient appears stated age and is in NAD. HEENT:  Normocephalic, atraumatic.   Neurological examination:  General: NAD, well-groomed,  appears stated age. Orientation: The patient is alert. Oriented to person, not to place and not to date Cranial nerves: There is good facial symmetry.The speech is fluent and clear, very repetitive. No aphasia or dysarthria. Fund of knowledge is reduced. Recent and remote memory are impaired. Attention and concentration are reduced.  Able to name objects and repeat phrases.  Hearing is intact to conversational tone.   Sensation: Sensation is intact to light touch throughout Motor: Strength is at least antigravity x4. DTR's 2/4 in UE/LE     Movement examination: Tone: There is normal tone in the UE/LE Abnormal movements:  no tremor.  No myoclonus.  No asterixis.   Coordination:  There is no decremation with RAM's. Normal finger to nose  Gait and Station: The patient has no difficulty arising out of a deep-seated chair without the use of the hands. The patient's stride length is good.  Gait is cautious and narrow.    Thank you for allowing [place] the opportunity to participate in the care of this nice patient. Please do not hesitate to contact us for any questions or concerns.   Total time spent on today's visit was 24 minutes dedicated to this patient today, preparing to see patient, examining the patient, ordering tests and/or medications and counseling the patient, documenting clinical information in the EHR or other health record, independently interpreting results and communicating results to the patient/family, discussing treatment and goals, answering patient's questions and coordinating care.  Cc:  Merri Brunette, MD  Marlowe Kays 11/24/2023 6:01 PM

## 2024-01-31 ENCOUNTER — Other Ambulatory Visit: Payer: Self-pay | Admitting: Physician Assistant

## 2024-05-11 DIAGNOSIS — I2510 Atherosclerotic heart disease of native coronary artery without angina pectoris: Secondary | ICD-10-CM | POA: Diagnosis not present

## 2024-05-11 DIAGNOSIS — N3281 Overactive bladder: Secondary | ICD-10-CM | POA: Diagnosis not present

## 2024-05-11 DIAGNOSIS — L03115 Cellulitis of right lower limb: Secondary | ICD-10-CM | POA: Diagnosis not present

## 2024-05-11 DIAGNOSIS — G301 Alzheimer's disease with late onset: Secondary | ICD-10-CM | POA: Diagnosis not present

## 2024-05-11 DIAGNOSIS — K219 Gastro-esophageal reflux disease without esophagitis: Secondary | ICD-10-CM | POA: Diagnosis not present

## 2024-05-11 DIAGNOSIS — E042 Nontoxic multinodular goiter: Secondary | ICD-10-CM | POA: Diagnosis not present

## 2024-05-11 DIAGNOSIS — Z0000 Encounter for general adult medical examination without abnormal findings: Secondary | ICD-10-CM | POA: Diagnosis not present

## 2024-05-11 DIAGNOSIS — J309 Allergic rhinitis, unspecified: Secondary | ICD-10-CM | POA: Diagnosis not present

## 2024-05-11 DIAGNOSIS — F02B0 Dementia in other diseases classified elsewhere, moderate, without behavioral disturbance, psychotic disturbance, mood disturbance, and anxiety: Secondary | ICD-10-CM | POA: Diagnosis not present

## 2024-05-11 DIAGNOSIS — I10 Essential (primary) hypertension: Secondary | ICD-10-CM | POA: Diagnosis not present

## 2024-05-11 DIAGNOSIS — J849 Interstitial pulmonary disease, unspecified: Secondary | ICD-10-CM | POA: Diagnosis not present

## 2024-05-11 DIAGNOSIS — N1831 Chronic kidney disease, stage 3a: Secondary | ICD-10-CM | POA: Diagnosis not present

## 2024-05-29 ENCOUNTER — Encounter: Payer: Self-pay | Admitting: Physician Assistant

## 2024-05-29 ENCOUNTER — Ambulatory Visit: Admitting: Physician Assistant

## 2024-05-29 VITALS — BP 135/66 | HR 69 | Resp 20 | Ht 66.5 in | Wt 145.0 lb

## 2024-05-29 DIAGNOSIS — F0280 Dementia in other diseases classified elsewhere without behavioral disturbance: Secondary | ICD-10-CM | POA: Diagnosis not present

## 2024-05-29 DIAGNOSIS — G309 Alzheimer's disease, unspecified: Secondary | ICD-10-CM

## 2024-05-29 NOTE — Patient Instructions (Signed)
 It was a pleasure to see you today at our office.   Recommendations:  Follow up in 6 mo  Continue taking B12 and D  Continue Aricept 5 mg daily   Start Memantine 5 mg: Take 1 tablet (5 mg at night) for 2 weeks, then increase to 1 tablet (5 mg) twice a day         For assessment of decision of mental capacity and competency:  Call Dr. Erick Blinks, geriatric psychiatrist at [phone]  Whom to call: Memory  decline, memory medications: Call our office [phone]  For psychiatric meds, mood meds: Please have your primary care physician manage these medications.  If you have any severe symptoms of a stroke, or other severe issues such as confusion,severe chills or fever, etc call 911 or go to the ER as you may need to be evaluated further      RECOMMENDATIONS FOR ALL PATIENTS WITH MEMORY PROBLEMS: 1. Continue to exercise (Recommend 30 minutes of walking everyday, or 3 hours every week) 2. Increase social interactions - continue going to [place] and enjoy social gatherings with friends and family 3. Eat healthy, avoid fried foods and eat more fruits and vegetables 4. Maintain adequate blood pressure, blood sugar, and blood cholesterol level. Reducing the risk of stroke and cardiovascular disease also helps promoting better memory. 5. Avoid stressful situations. Live a simple life and avoid aggravations. Organize your time and prepare for the next day in anticipation. 6. Sleep well, avoid any interruptions of sleep and avoid any distractions in the bedroom that may interfere with adequate sleep quality 7. Avoid sugar, avoid sweets as there is a strong link between excessive sugar intake, diabetes, and cognitive impairment We discussed the Mediterranean diet, which has been shown to help patients reduce the risk of progressive memory disorders and reduces cardiovascular risk. This includes eating fish, eat fruits and green leafy vegetables, nuts like almonds and hazelnuts, walnuts, and  also use olive oil. Avoid fast foods and fried foods as much as possible. Avoid sweets and sugar as sugar use has been linked to worsening of memory function.  There is always a concern of gradual progression of memory problems. If this is the case, then we may need to adjust level of care according to patient needs. Support, both to the patient and caregiver, should then be put into place.    The Alzheimer's Association is here all day, every day for people facing Alzheimer's disease through our free 24/7 Helpline: [phone]. The Helpline provides reliable information and support to all those who need assistance, such as individuals living with memory loss, Alzheimer's or other dementia, caregivers, health care professionals and the public.  Our highly trained and knowledgeable staff can help you with: Understanding memory loss, dementia and Alzheimer's  Medications and other treatment options  General information about aging and brain health  Skills to provide quality care and to find the best care from professionals  Legal, financial and living-arrangement decisions Our Helpline also features: Confidential care consultation provided by master's level clinicians who can help with decision-making support, crisis assistance and education on issues families face every day  Help in a caller's preferred language using our translation service that features more than 200 languages and dialects  Referrals to local community programs, services and ongoing support     FALL PRECAUTIONS: Be cautious when walking. Scan the area for obstacles that may increase the risk of trips and falls. When getting up in the mornings, sit up at  the edge of the bed for a few minutes before getting out of bed. Consider elevating the bed at the head end to avoid drop of blood pressure when getting up. Walk always in a well-lit room (use night lights in the walls). Avoid area rugs or power cords from appliances in the  middle of the walkways. Use a walker or a cane if necessary and consider physical therapy for balance exercise. Get your eyesight checked regularly.  FINANCIAL OVERSIGHT: Supervision, especially oversight when making financial decisions or transactions is also recommended.  HOME SAFETY: Consider the safety of the kitchen when operating appliances like stoves, microwave oven, and blender. Consider having supervision and share cooking responsibilities until no longer able to participate in those. Accidents with firearms and other hazards in the house should be identified and addressed as well.   ABILITY TO BE LEFT ALONE: If patient is unable to contact 911 operator, consider using LifeLine, or when the need is there, arrange for someone to stay with patients. Smoking is a fire hazard, consider supervision or cessation. Risk of wandering should be assessed by caregiver and if detected at any point, supervision and safe proof recommendations should be instituted.  MEDICATION SUPERVISION: Inability to self-administer medication needs to be constantly addressed. Implement a mechanism to ensure safe administration of the medications.   DRIVING: Regarding driving, in patients with progressive memory problems, driving will be impaired. We advise to have someone else do the driving if trouble finding directions or if minor accidents are reported. Independent driving assessment is available to determine safety of driving.   If you are interested in the driving assessment, you can contact the following:  [facility] [employer] [facility] in [place] [phone]  [facility] [phone]  [facility] [phone] or [phone]      Mediterranean Diet A Mediterranean diet refers to food and lifestyle choices that are based on the traditions of countries located on the [employer]. This way of eating has been shown to help prevent certain  conditions and improve outcomes for people who have chronic diseases, like kidney disease and heart disease. What are tips for following this plan? Lifestyle  Cook and eat meals together with your family, when possible. Drink enough fluid to keep your urine clear or pale yellow. Be physically active every day. This includes: Aerobic exercise like running or swimming. Leisure activities like gardening, walking, or housework. Get 7-8 hours of sleep each night. If recommended by your health care provider, drink red wine in moderation. This means 1 glass a day for nonpregnant women and 2 glasses a day for men. A glass of wine equals 5 oz (150 mL). Reading food labels  Check the serving size of packaged foods. For foods such as rice and pasta, the serving size refers to the amount of cooked product, not dry. Check the total fat in packaged foods. Avoid foods that have saturated fat or trans fats. Check the ingredients list for added sugars, such as corn syrup. Shopping  At the grocery store, buy most of your food from the areas near the walls of the store. This includes: Fresh fruits and vegetables (produce). Grains, beans, nuts, and seeds. Some of these may be available in unpackaged forms or large amounts (in bulk). Fresh seafood. Poultry and eggs. Low-fat dairy products. Buy whole ingredients instead of prepackaged foods. Buy fresh fruits and vegetables in-season from local farmers markets. Buy frozen fruits and vegetables in resealable bags. If you do not  have access to quality fresh seafood, buy precooked frozen shrimp or canned fish, such as tuna, salmon, or sardines. Buy small amounts of raw or cooked vegetables, salads, or olives from the deli or salad bar at your store. Stock your pantry so you always have certain foods on hand, such as olive oil, canned tuna, canned tomatoes, rice, pasta, and beans. Cooking  Cook foods with extra-virgin olive oil instead of using butter or other  vegetable oils. Have meat as a side dish, and have vegetables or grains as your main dish. This means having meat in small portions or adding small amounts of meat to foods like pasta or stew. Use beans or vegetables instead of meat in common dishes like chili or lasagna. Experiment with different cooking methods. Try roasting or broiling vegetables instead of steaming or sauteing them. Add frozen vegetables to soups, stews, pasta, or rice. Add nuts or seeds for added healthy fat at each meal. You can add these to yogurt, salads, or vegetable dishes. Marinate fish or vegetables using olive oil, lemon juice, garlic, and fresh herbs. Meal planning  Plan to eat 1 vegetarian meal one day each week. Try to work up to 2 vegetarian meals, if possible. Eat seafood 2 or more times a week. Have healthy snacks readily available, such as: Vegetable sticks with hummus. Greek yogurt. Fruit and nut trail mix. Eat balanced meals throughout the week. This includes: Fruit: 2-3 servings a day Vegetables: 4-5 servings a day Low-fat dairy: 2 servings a day Fish, poultry, or lean meat: 1 serving a day Beans and legumes: 2 or more servings a week Nuts and seeds: 1-2 servings a day Whole grains: 6-8 servings a day Extra-virgin olive oil: 3-4 servings a day Limit red meat and sweets to only a few servings a month What are my food choices? Mediterranean diet Recommended Grains: Whole-grain pasta. Brown rice. Bulgar wheat. Polenta. Couscous. Whole-wheat bread. Orpah Cobb. Vegetables: Artichokes. Beets. Broccoli. Cabbage. Carrots. Eggplant. Green beans. Chard. Kale. Spinach. Onions. Leeks. Peas. Squash. Tomatoes. Peppers. Radishes. Fruits: Apples. Apricots. Avocado. Berries. Bananas. Cherries. Dates. Figs. Grapes. Lemons. Melon. Oranges. Peaches. Plums. Pomegranate. Meats and other protein foods: Beans. Almonds. Sunflower seeds. Pine nuts. Peanuts. Cod. Salmon. Scallops. Shrimp. Tuna. Tilapia. Clams.  Oysters. Eggs. Dairy: Low-fat milk. Cheese. Greek yogurt. Beverages: Water. Red wine. Herbal tea. Fats and oils: Extra virgin olive oil. Avocado oil. Grape seed oil. Sweets and desserts: [place] yogurt with honey. Baked apples. Poached pears. Trail mix. Seasoning and other foods: Basil. Cilantro. Coriander. Cumin. Mint. Parsley. Sage. Rosemary. Tarragon. Garlic. Oregano. Thyme. Pepper. Balsalmic vinegar. Tahini. Hummus. Tomato sauce. Olives. Mushrooms. Limit these Grains: Prepackaged pasta or rice dishes. Prepackaged cereal with added sugar. Vegetables: Deep fried potatoes (french fries). Fruits: Fruit canned in syrup. Meats and other protein foods: Beef. Pork. Lamb. Poultry with skin. Hot dogs. Tomasa Blase. Dairy: Ice cream. Sour cream. Whole milk. Beverages: Juice. Sugar-sweetened soft drinks. Beer. Liquor and spirits. Fats and oils: Butter. Canola oil. Vegetable oil. Beef fat (tallow). Lard. Sweets and desserts: Cookies. Cakes. Pies. Candy. Seasoning and other foods: Mayonnaise. Premade sauces and marinades. The items listed may not be a complete list. Talk with your dietitian about what dietary choices are right for you. Summary The Mediterranean diet includes both food and lifestyle choices. Eat a variety of fresh fruits and vegetables, beans, nuts, seeds, and whole grains. Limit the amount of red meat and sweets that you eat. Talk with your health care provider about whether it is safe for you  to drink red wine in moderation. This means 1 glass a day for nonpregnant women and 2 glasses a day for men. A glass of wine equals 5 oz (150 mL). This information is not intended to replace advice given to you by your health care provider. Make sure you discuss any questions you have with your health care provider. Document Released: 03/25/2016 Document Revised: 04/27/2016 Document Reviewed: 03/25/2016 Elsevier Interactive Patient Education  2017 [employer].

## 2024-05-29 NOTE — Progress Notes (Signed)
 Assessment/Plan:   Dementia likely due to Alzheimer disease   Bianca [name] is a very pleasant 78 y.o. RH female with a history of hypertension, hyperlipidemia, chronic back and neck pain, IBS, history multinodular goiter and dementia likely due to Alzheimer's disease seen today in follow up for memory loss. Patient is currently on donepezil 5 mg daily (GI symptoms with 10 mg) memantine  5 mg nightly (niece was concerned about the logistics), tolerating well. Cognitive decline is noted during this visit. We discussed to proceed with memantine  5 mg bid for better coverage given cognitive decline.    Follow up in  6 months. Continue to donepezil 5 mg daily, side effects discussed Increase memantine  to 5 mg twice daily, goal 10 mg bid if tolerated, side effects discussed  Recommend good control of her cardiovascular risk factors Continue 24/7 monitoring, she may benefit from Adult Day Care for cognitive, social simulation and safety.  Continue to control mood as per PCP     Subjective:    This patient is accompanied in the office by her family member who supplements the history.  Previous records as well as any outside records available were reviewed prior to todays visit. Patient was last seen on 11/24/2023, last MoCA in August 2024 was 13/30    Any changes in memory since last visit? There is a decline since her last visit, more STM loss-niece says . She is very active, enjoys reading, planting flowers on her yard, she uses sticky notes to remember.  She enjoys having visitors.   She even does not recognize the way home from her sister. repeats oneself?  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 You need to answer the question 10 times before she remembers--niece says. Disoriented when walking into a room?  She may not recognize a guest room or some other room in the house   Leaving objects?  May misplace things but  not in unusual places   Wandering behavior?  denies   Any personality changes since last visit?  Denies.   Any worsening depression?:  Denies.   Hallucinations or paranoia?  Denies.   Seizures? denies    Any sleep changes? She likes to cat nap on the recliner till around midnight and then gets herself to bed.  Denies vivid dreams, REM behavior or sleepwalking   Sleep apnea?   Denies.   Any hygiene concerns? Resistant to wash her hair, shower, nail care, going to the salon -niece says  Independent of bathing and dressing?  Endorsed  Does the patient needs help with medications? Niece is in charge   Who is in charge of the finances?  Niece is in charge     Any changes in appetite?  denies     Patient have trouble swallowing? Denies.   Does the patient cook? No Any headaches?   denies   Any vision changes?  Chronic back pain  denies   Ambulates with difficulty?  She is less active in the yard, planting flowers than last year. Family hired a company to do these tasks.   Recent falls or head injuries? She is at risk of falls as she has many oriental rugs and the house is crowded with furniture and knickknacks. She hit the back of her leg, needing keflex, but she does not remember how it happened.  Unilateral weakness, numbness or tingling? denies   Any tremors?  Denies  Any anosmia?  Denies   Any incontinence of urine?  Endorsed   Any bowel dysfunction?   Denies      Patient lives alone after her husband's death her family member monitors, also has 24/7 companions.   Does the patient drive? No longer drives. Has a driver/companion to drive her wherever she needs to go      Initial visit 01/03/2023 How long did patient have memory difficulties?  Her husband died of cancer in 08/28/22. She began showing some cognitive decline around that time, therefore her family took her to her PCP. He suspected dementia with a situational depression component . He placed her on Aricept 5 mg  daily, but lately she was not compliant.  Patient has some difficulty remembering recent conversations and people names, she needs more time to retrieve the information. LTM is good. She is very active, enjoys weaving, planting flowers in her [address] [place] repeats oneself?  Endorsed by her niece. Usually about subjects that involve her family Disoriented when walking into a room?  Patient denies  Leaving objects in unusual places?   denies   Wandering behavior? denies   Any personality changes ? denies   Any history of depression?: denies.   Hallucinations or paranoia?  Not frequently.  She smelled smoke in the middle of the night, a door bell ringing and even called the police for that. . In the past she saw some lights at the end of the farm when she was taking care of her husband, but not recently.  Seizures? denies    Any sleep changes?  Denies  vivid dreams, REM behavior or sleepwalking   Sleep apnea? denies   Any hygiene concerns?  Less frequent than before.  Independent of bathing and dressing?  Endorsed  Does the patient need help with medications? Patient is in charge may be missing some doses. Who is in charge of the finances?  Family is in charge     Any changes in appetite?   denies     Patient have trouble swallowing?  denies   Does the patient cook? When I have to.    Any kitchen accidents such as leaving the stove on? Patient denies   Any headaches?  denies   Chronic back pain?  denies   Ambulates with difficulty? denies   Recent falls or head injuries? denies     Vision changes? Unilateral weakness, numbness or tingling? denies   Any tremors?  denies   Any anosmia?  denies   Any incontinence of urine? denies   Any bowel dysfunction? denies      Patient lives alone since her husband's death.     History of heavy alcohol intake? denies   History of heavy tobacco use? denies   Family history of dementia?  Denies     Does patient drive? Endorsed, locally. Some  family members help out as well.     Personally reviewed MRI of the brain from March 2024 is remarkable for advanced generalized volume loss with temporal lobe predominance, and moderate chronic microvascular ischemic disease    PREVIOUS MEDICATIONS: Donepezil 10 mg daily (GI)  CURRENT MEDICATIONS:  Outpatient Encounter Medications as of 05/29/2024  Medication Sig   aspirin 81 MG tablet Take 81 mg by mouth as needed.   Cholecalciferol (VITAMIN D PO) Take 1,000 Units by mouth daily.   Cyanocobalamin  (VITAMIN B 12 PO) Take 1,000 mcg by mouth daily.    donepezil (ARICEPT) 5 MG tablet Take  5 mg by mouth at bedtime.   memantine  (NAMENDA ) 5 MG tablet TAKE 1 TABLET AT NIGHT FOR 2 WEEKS, THEN INCREASE TO 1 TABLET TWICE A DAY   pyridoxine (B-6) 100 MG tablet Take 100 mg by mouth daily.   telmisartan (MICARDIS) 80 MG tablet Take 80 mg by mouth daily.   No facility-administered encounter medications on file as of 05/29/2024.        No data to display            01/04/2023    2:00 PM 01/03/2023    5:00 PM  Montreal Cognitive Assessment   Visuospatial/ Executive (0/5) 4 4  Naming (0/3) 2 2  Attention: Read list of digits (0/2) 2 2  Attention: Read list of letters (0/1) 1 1  Attention: Serial 7 subtraction starting at 100 (0/3) 1 1  Language: Repeat phrase (0/2) 0 0  Language : Fluency (0/1) 0 0  Abstraction (0/2) 0 0  Delayed Recall (0/5) 0 0  Orientation (0/6) 3 3  Total 13 13  Adjusted Score (based on education)  13    Objective:     PHYSICAL EXAMINATION:    VITALS:   Vitals:   05/29/24 1458  BP: 135/66  Pulse: 69  Resp: 20  SpO2: 98%  Weight: 145 lb (65.8 kg)  Height: 5' 6.5 (1.689 m)    GEN:  The patient appears stated age and is in NAD. HEENT:  Normocephalic, atraumatic.   Neurological examination:  General: NAD, well-groomed, appears stated age. Orientation: The patient is alert. Oriented to person, not place or date Cranial nerves: There is good facial  symmetry.The speech is fluent and clear. No aphasia or dysarthria. Fund of knowledge is reduced. Recent and remote memory are impaired. Attention and concentration are reduced. Able to name objects and repeat phrases.  Hearing is intact to conversational tone.   Sensation: Sensation is intact to light touch throughout Motor: Strength is at least antigravity x4. DTR's 2/4 in UE/LE     Movement examination: Tone: There is normal tone in the UE/LE Abnormal movements:  no tremor.  No myoclonus.  No asterixis.   Coordination:  There is no decremation with RAM's. Normal finger to nose  Gait and Station: The patient has no  difficulty arising out of a deep-seated chair without the use of the hands. The patient's stride length is good.  Gait is cautious and narrow.    Thank you for allowing us  the opportunity to participate in the care of this nice patient. Please do not hesitate to contact us  for any questions or concerns.   Total time spent on today's visit was 33  minutes dedicated to this patient today, preparing to see patient, examining the patient, ordering tests and/or medications and counseling the patient, documenting clinical information in the EHR or other health record, independently interpreting results and communicating results to the patient/family, discussing treatment and goals, answering patient's questions and coordinating care.  Cc:  Clarice Nottingham, MD  Camie Sevin 05/29/2024 5:28 PM

## 2024-05-30 DIAGNOSIS — I10 Essential (primary) hypertension: Secondary | ICD-10-CM | POA: Diagnosis not present

## 2024-05-30 DIAGNOSIS — E7800 Pure hypercholesterolemia, unspecified: Secondary | ICD-10-CM | POA: Diagnosis not present

## 2024-05-30 DIAGNOSIS — N1831 Chronic kidney disease, stage 3a: Secondary | ICD-10-CM | POA: Diagnosis not present

## 2024-07-29 ENCOUNTER — Emergency Department (HOSPITAL_BASED_OUTPATIENT_CLINIC_OR_DEPARTMENT_OTHER)

## 2024-07-29 ENCOUNTER — Emergency Department (HOSPITAL_BASED_OUTPATIENT_CLINIC_OR_DEPARTMENT_OTHER)
Admission: EM | Admit: 2024-07-29 | Discharge: 2024-07-29 | Disposition: A | Discharge status: Home / Self Care | Attending: Emergency Medicine | Admitting: Emergency Medicine

## 2024-07-29 ENCOUNTER — Other Ambulatory Visit: Payer: Self-pay

## 2024-07-29 ENCOUNTER — Encounter (HOSPITAL_BASED_OUTPATIENT_CLINIC_OR_DEPARTMENT_OTHER): Payer: Self-pay | Admitting: Emergency Medicine

## 2024-07-29 DIAGNOSIS — S62345A Nondisplaced fracture of base of fourth metacarpal bone, left hand, initial encounter for closed fracture: Secondary | ICD-10-CM | POA: Diagnosis not present

## 2024-07-29 DIAGNOSIS — I2510 Atherosclerotic heart disease of native coronary artery without angina pectoris: Secondary | ICD-10-CM | POA: Diagnosis not present

## 2024-07-29 DIAGNOSIS — F0390 Unspecified dementia without behavioral disturbance: Secondary | ICD-10-CM | POA: Diagnosis not present

## 2024-07-29 DIAGNOSIS — S62311A Displaced fracture of base of second metacarpal bone. left hand, initial encounter for closed fracture: Secondary | ICD-10-CM | POA: Diagnosis not present

## 2024-07-29 DIAGNOSIS — S62305A Unspecified fracture of fourth metacarpal bone, left hand, initial encounter for closed fracture: Secondary | ICD-10-CM | POA: Insufficient documentation

## 2024-07-29 DIAGNOSIS — S62301A Unspecified fracture of second metacarpal bone, left hand, initial encounter for closed fracture: Secondary | ICD-10-CM | POA: Insufficient documentation

## 2024-07-29 DIAGNOSIS — W1839XA Other fall on same level, initial encounter: Secondary | ICD-10-CM | POA: Insufficient documentation

## 2024-07-29 DIAGNOSIS — S62341A Nondisplaced fracture of base of second metacarpal bone. left hand, initial encounter for closed fracture: Secondary | ICD-10-CM | POA: Diagnosis not present

## 2024-07-29 DIAGNOSIS — M25542 Pain in joints of left hand: Secondary | ICD-10-CM | POA: Diagnosis not present

## 2024-07-29 DIAGNOSIS — S62315A Displaced fracture of base of fourth metacarpal bone, left hand, initial encounter for closed fracture: Secondary | ICD-10-CM | POA: Diagnosis not present

## 2024-07-29 DIAGNOSIS — S6992XA Unspecified injury of left wrist, hand and finger(s), initial encounter: Secondary | ICD-10-CM | POA: Diagnosis not present

## 2024-07-29 DIAGNOSIS — M85842 Other specified disorders of bone density and structure, left hand: Secondary | ICD-10-CM | POA: Diagnosis not present

## 2024-07-29 MED ORDER — ACETAMINOPHEN 325 MG PO TABS
650.0000 mg | ORAL_TABLET | Freq: Once | ORAL | Status: AC
  Administered 2024-07-29: 650 mg via ORAL
  Filled 2024-07-29: qty 2

## 2024-07-29 NOTE — ED Provider Notes (Addendum)
 [place] EMERGENCY DEPARTMENT AT [facility] Provider Note   CSN: [id number] Arrival date & time: 07/29/24  1447     Patient presents with: Hand Injury   Bianca [name] is a 78 y.o. female.    Hand Injury    78 year old female with medical history significant for dementia, CAD presenting to the emergency department with a chief complaint of hand pain and swelling.  The patient was last seen normal on Friday by family members.  She sustained a fall.  The patient states that she fell outside however she is a poor historian due to her dementia and memory issues.  No clear head trauma, denies any head pain or neck pain.  The patient is left-handed and sustained significant pain and swelling to the entirety of the left hand.  She arrives GCS 14, ABC intact.  Prior to Admission medications  Medication Sig Start Date End Date Taking? Authorizing Provider  aspirin 81 MG tablet Take 81 mg by mouth as needed.    [provider]  Cholecalciferol (VITAMIN D PO) Take 1,000 Units by mouth daily.    [provider]  Cyanocobalamin  (VITAMIN B 12 PO) Take 1,000 mcg by mouth daily.     [provider]  donepezil (ARICEPT) 5 MG tablet Take 5 mg by mouth at bedtime. 03/09/23   [provider]  memantine  (NAMENDA ) 5 MG tablet TAKE 1 TABLET AT NIGHT FOR 2 WEEKS, THEN INCREASE TO 1 TABLET TWICE A DAY 01/31/24   Dina, Sara E, PA-C  pyridoxine (B-6) 100 MG tablet Take 100 mg by mouth daily.    [provider]  telmisartan (MICARDIS) 80 MG tablet Take 80 mg by mouth daily. 08/07/15   [provider]    Allergies: Patient has no known allergies.    Review of Systems  Unable to perform ROS: Dementia    Updated Vital Signs BP (!) 141/75 (BP Location: Right Arm)   Pulse 88   Temp 97.9 F (36.6 C) (Oral)   Resp 18   SpO2 99%   Physical Exam Vitals and nursing note reviewed.  Constitutional:      General: She is not in acute  distress. HENT:     Head: Normocephalic and atraumatic.  Eyes:     Conjunctiva/sclera: Conjunctivae normal.     Pupils: Pupils are equal, round, and reactive to light.  Cardiovascular:     Rate and Rhythm: Normal rate and regular rhythm.  Pulmonary:     Effort: Pulmonary effort is normal. No respiratory distress.  Abdominal:     General: There is no distension.     Tenderness: There is no guarding.  Musculoskeletal:        General: No deformity or signs of injury.     Cervical back: Neck supple.     Comments: Left hand with 1+ radial pulses, diffuse swelling throughout the dorsum of the hand with ecchymosis present throughout the fingers and palmar aspect and dorsum of the hand, reduced flexion and extension of the digits of the hand, limited by pain and swelling, intact motor function along the median, ulnar, radial nerve distributions  Skin:    Findings: No lesion or rash.  Neurological:     General: No focal deficit present.     Mental Status: She is alert. Mental status is at baseline.     (all labs ordered are listed, but only abnormal results are displayed) Labs Reviewed - No data to display  EKG: None  Radiology: DG  Hand Complete Left Result Date: 07/29/2024 CLINICAL DATA:  Pain EXAM: LEFT HAND - COMPLETE 3+ VIEW COMPARISON:  May 04, 2021 FINDINGS: Osteopenia. There is an oblique mildly comminuted nondisplaced fracture of the fourth proximal metacarpal. There is an additional oblique minimally displaced fracture of the second proximal metacarpal. Evaluation for intra-articular extension is limited. Osseous sequela of remote prior radial head fracture and ulnar styloid fracture. Joint space narrowing with subcortical cyst formation of the radiocarpal joint. Mild joint space narrowing and osteophyte formation of the first CMC. Scattered degenerative changes of the DIPs. Dorsal soft tissue edema IMPRESSION: Acute fractures of the second and fourth proximal metacarpals.  Electronically Signed   By: Corean Salter M.D.   On: 07/29/2024 15:28     Procedures   Medications Ordered in the ED  acetaminophen  (TYLENOL ) tablet 650 mg (650 mg Oral Given 07/29/24 1617)                                    Medical Decision Making Amount and/or Complexity of Data Reviewed Radiology: ordered.  Risk OTC drugs.    78 year old female with medical history significant for dementia, CAD presenting to the emergency department with a chief complaint of hand pain and swelling.  The patient was last seen normal on Friday by family members.  She sustained a fall.  The patient states that she fell outside however she is a poor historian due to her dementia and memory issues.  No clear head trauma, denies any head pain or neck pain.  The patient is left-handed and sustained significant pain and swelling to the entirety of the left hand.  She arrives GCS 14, ABC intact.  On arrival, the patient was vitally stable.  Presenting with hand injury as noted on exam, neurovascularly intact.  X-ray imaging obtained: IMPRESSION:  Acute fractures of the second and fourth proximal metacarpals.    Does have decreased range of motion on flexion and extension of the digits likely due to swelling and fracture.  Intact pulses, able to make a thumbs up, okay sign and spread fingers out.    Discussed with hand surgery given extensive swelling and need for likely bilateral splinting along the radial and ulnar gutter. Spoke with Dr. Agarwala, recommended a dorsal and volar slab and outpatient follow-up. Pt post splint placement with intact capillary refill in all digits, advised rest, ice, elevation, tylenol  and NSAIDs for pain control.     Final diagnoses:  Closed nondisplaced fracture of second metacarpal bone of left hand, unspecified portion of metacarpal, initial encounter  Closed nondisplaced fracture of fourth metacarpal bone of left hand, unspecified portion of metacarpal, initial  encounter    ED Discharge Orders          Ordered    Ambulatory referral to Hand Surgery        07/29/24 1626               Jerrol Agent, MD 07/29/24 1626    Jerrol Agent, MD 07/29/24 1708

## 2024-07-29 NOTE — Discharge Instructions (Addendum)
 Follow-up with hand surgery for continued outpatient management, you have been placed in a splint for comfort, Tylenol  and ibuprofen for pain control, ice, elevation of the extremity as well

## 2024-07-29 NOTE — ED Triage Notes (Signed)
 Reports left hand pain. Swollen and bruised. Hx of dementia. Patient states family hand was not swollen yesterday.

## 2024-08-03 NOTE — Progress Notes (Unsigned)
 "  Bianca [name] - 78 y.o. female MRN [id number]  Date of birth: 24-Aug-1945  Office Visit Note: Visit Date: 08/06/2024 PCP: Clarice Nottingham, MD Referred by: Clarice Nottingham, MD  Subjective: No chief complaint on file.  HPI: Bianca [name] is a pleasant 78 y.o. female who presents today for ***  Pertinent ROS were reviewed with the patient and found to be negative unless otherwise specified above in HPI.   Visit Reason: Duration of symptoms: Hand dominance: {RIGHT/LEFT:20294} Occupation: Diabetic: {yes/no:20286} Smoking: {yes/no:20286} Heart/Lung History: Blood Thinners:   Prior Testing/EMG: Injections (Date): Treatments: Prior Surgery:    Assessment & Plan: Visit Diagnoses: No diagnosis found.  Plan: ***  Follow-up: No follow-ups on file.   Meds & Orders: No orders of the defined types were placed in this encounter.  No orders of the defined types were placed in this encounter.    Procedures: No procedures performed      Clinical History: No specialty comments available.  She reports that she has quit smoking. Her smoking use included cigarettes. She has never used smokeless tobacco. No results for input(s): HGBA1C, LABURIC in the last 8760 hours.  Objective:   Vital Signs: There were no vitals taken for this visit.  Physical Exam  Gen: Well-appearing, in no acute distress; non-toxic CV: Regular Rate. Well-perfused. Warm.  Resp: Breathing unlabored on room air; no wheezing. Psych: Fluid speech in conversation; appropriate affect; normal thought process  Ortho Exam - ***   Imaging: No results found.  Past Medical/Family/Surgical/Social History: Medications & Allergies reviewed per EMR, new medications updated. Patient Active Problem List   Diagnosis Date Noted   Chronic sinusitis 10/21/2017   ILD (interstitial lung disease) (HCC) 03/10/2016   Cystadenofibroma of ovary    Arthritis    Hyperlipidemia, mixed 05/03/2009   Essential hypertension  05/03/2009   CAD 05/03/2009   Past Medical History:  Diagnosis Date   Arthritis    Coronary artery disease    Elevated cholesterol    Hypertension    ILD (interstitial lung disease) (HCC)    mild ild per lov dr jude pumonary 10-21-2017   Left wrist fracture 05/04/2021   in sling   Memory problem 05/06/2021   no diagnosis signs owns consent sister in law peggy claudene helps due to spouse recent tonue surgery no formal poa   Poor historian 05/06/2021   no diagnosis but sister in law peggy claudene helps due to spouse recent tongue surgery   Family History  Problem Relation Age of Onset   Pancreatic cancer Mother    Hypertension Father    Heart disease Father    Heart disease Paternal Grandfather    Stroke Paternal Grandfather    Past Surgical History:  Procedure Laterality Date   ABDOMINAL SURGERY  1988   Exp. Laparotomy -Micro tuboplasty   ANTERIOR CRUCIATE LIGAMENT REPAIR     COSMETIC SURGERY     HARDWARE REMOVAL Left 06/04/2021   Procedure: HARDWARE REMOVAL;  Surgeon: Alyse Agent, MD;  Location: [facility];  Service: Orthopedics;  Laterality: Left;  9 screws and 1 plate   hemorrdectomy     NASAL SINUS SURGERY     OOPHORECTOMY  2009   BSO cystadenofibroma   ORIF WRIST FRACTURE Left 05/07/2021   Procedure: OPEN REDUCTION INTERNAL FIXATION (ORIF) WRIST FRACTURE;  Surgeon: Alyse Agent, MD;  Location: [facility];  Service: Orthopedics;  Laterality: Left;  with MAC   PELVIC LAPAROSCOPY     DL with tubal  lavage   Social History   Occupational History   Not on file  Tobacco Use   Smoking status: Former    Types: Cigarettes   Smokeless tobacco: Never   Tobacco comments:     Smoking cigarettes Quit age 71 smoked age 5 to 50 1 ppd per pt  Vaping Use   Vaping status: Never Used  Substance and Sexual Activity   Alcohol use: Yes    Alcohol/week: 0.0 standard drinks of alcohol    Comment: Rare   Drug use: No   Sexual activity: Never    Birth  control/protection: Post-menopausal    Comment: 1st intercourse 78 yo-Fewer than 5 partners     Estela) Arlinda, M.D. [facility], Hand Surgery  "

## 2024-08-05 ENCOUNTER — Other Ambulatory Visit: Payer: Self-pay | Admitting: Physician Assistant

## 2024-08-06 ENCOUNTER — Other Ambulatory Visit (INDEPENDENT_AMBULATORY_CARE_PROVIDER_SITE_OTHER): Payer: Self-pay

## 2024-08-06 ENCOUNTER — Ambulatory Visit: Admitting: Orthopedic Surgery

## 2024-08-06 DIAGNOSIS — M79642 Pain in left hand: Secondary | ICD-10-CM

## 2024-08-06 DIAGNOSIS — S62603A Fracture of unspecified phalanx of left middle finger, initial encounter for closed fracture: Secondary | ICD-10-CM

## 2024-08-06 DIAGNOSIS — S62609A Fracture of unspecified phalanx of unspecified finger, initial encounter for closed fracture: Secondary | ICD-10-CM

## 2024-08-06 DIAGNOSIS — S62351A Nondisplaced fracture of shaft of second metacarpal bone, left hand, initial encounter for closed fracture: Secondary | ICD-10-CM | POA: Diagnosis not present

## 2024-08-06 DIAGNOSIS — S62355A Nondisplaced fracture of shaft of fourth metacarpal bone, left hand, initial encounter for closed fracture: Secondary | ICD-10-CM | POA: Diagnosis not present

## 2024-09-03 ENCOUNTER — Other Ambulatory Visit: Payer: Self-pay

## 2024-09-03 ENCOUNTER — Other Ambulatory Visit (INDEPENDENT_AMBULATORY_CARE_PROVIDER_SITE_OTHER): Payer: Self-pay

## 2024-09-03 ENCOUNTER — Ambulatory Visit: Admitting: Orthopedic Surgery

## 2024-09-03 DIAGNOSIS — S62351A Nondisplaced fracture of shaft of second metacarpal bone, left hand, initial encounter for closed fracture: Secondary | ICD-10-CM

## 2024-09-03 DIAGNOSIS — S62609A Fracture of unspecified phalanx of unspecified finger, initial encounter for closed fracture: Secondary | ICD-10-CM

## 2024-09-03 NOTE — Progress Notes (Signed)
 "  Bianca [name] - 79 y.o. female MRN [id number]  Date of birth: 1946-06-04  Office Visit Note: Visit Date: 09/03/2024 PCP: Clarice Nottingham, MD Referred by: Clarice Nottingham, MD  Subjective: No chief complaint on file.  HPI: MAKINNA ANDY is a pleasant 79 y.o. female who presents today for follow up of the left hand.  She under went closed reduction of a left long finger PIP dislocation in the office at her prior visit approximately 3 weeks prior.  She is also sustained minimally spaced fractures of the index and ring finger metacarpal bases.  Currently, pain is well-controlled, swelling is down, range of motion is appropriate.  Does have some ongoing soreness and swelling at the long finger PIP region.  Pertinent ROS were reviewed with the patient and found to be negative unless otherwise specified above in HPI.     Assessment & Plan: Visit Diagnoses:  1. Closed nondisplaced fracture of shaft of second metacarpal bone of left hand, initial encounter   2. Closed fracture dislocation of proximal interphalangeal (PIP) joint of finger, initial encounter     Plan: Repeat x-rays today show stable appearance of the left long finger PIP joint status post closed reduction.  Index and ring finger metacarpal base fractures also with interval healing both radiographically and clinically.  She is demonstrating appropriate range of motion today on examination without any ongoing instability.  She should continue with activities as tolerated moving forward.  She is welcome to return to me as needed at this juncture.  Daughter was present today and expressed full understanding.  Follow-up: No follow-ups on file.   Meds & Orders: No orders of the defined types were placed in this encounter.   Orders Placed This Encounter  Procedures   XR Hand Complete Left   XR Finger Middle Left     Procedures: No procedures performed      Clinical History: No specialty comments available.  She reports that she  has quit smoking. Her smoking use included cigarettes. She has never used smokeless tobacco. No results for input(s): HGBA1C, LABURIC in the last 8760 hours.  Objective:   Vital Signs: There were no vitals taken for this visit.  Physical Exam  Gen: Well-appearing, in no acute distress; non-toxic CV: Regular Rate. Well-perfused. Warm.  Resp: Breathing unlabored on room air; no wheezing. Psych: Fluid speech in conversation; appropriate affect; normal thought process  Ortho Exam Left hand: - Minimal tenderness diffusely to palpation, notable swelling and tenderness over the long finger PIP region, range of motion is appropriate to the digits, near composite fist without restriction, no rotational abnormalities or digital overlap, PIP with gentle stress testing without recurrent instability of the long finger   Imaging: XR Finger Middle Left Result Date: 09/03/2024 X-ray of the left long finger demonstrates stable appearance of the long finger PIP, remains well located in all planes.  XR Hand Complete Left Result Date: 09/03/2024 X-rays of the left hand demonstrate previously known index and ring finger metacarpal shaft fractures, oblique in nature.  There is evidence of some mild callus formation without interval displacement   Past Medical/Family/Surgical/Social History: Medications & Allergies reviewed per EMR, new medications updated. Patient Active Problem List   Diagnosis Date Noted   Chronic sinusitis 10/21/2017   ILD (interstitial lung disease) (HCC) 03/10/2016   Cystadenofibroma of ovary    Arthritis    Hyperlipidemia, mixed 05/03/2009   Essential hypertension 05/03/2009   CAD 05/03/2009   Past Medical History:  Diagnosis Date  Arthritis    Coronary artery disease    Elevated cholesterol    Hypertension    ILD (interstitial lung disease) (HCC)    mild ild per lov dr jude pumonary 10-21-2017   Left wrist fracture 05/04/2021   in sling   Memory problem 05/06/2021    no diagnosis signs owns consent sister in law peggy claudene helps due to spouse recent tonue surgery no formal poa   Poor historian 05/06/2021   no diagnosis but sister in law peggy claudene helps due to spouse recent tongue surgery   Family History  Problem Relation Age of Onset   Pancreatic cancer Mother    Hypertension Father    Heart disease Father    Heart disease Paternal Grandfather    Stroke Paternal Grandfather    Past Surgical History:  Procedure Laterality Date   ABDOMINAL SURGERY  1988   Exp. Laparotomy -Micro tuboplasty   ANTERIOR CRUCIATE LIGAMENT REPAIR     COSMETIC SURGERY     HARDWARE REMOVAL Left 06/04/2021   Procedure: HARDWARE REMOVAL;  Surgeon: Alyse Agent, MD;  Location: [facility];  Service: Orthopedics;  Laterality: Left;  9 screws and 1 plate   hemorrdectomy     NASAL SINUS SURGERY     OOPHORECTOMY  2009   BSO cystadenofibroma   ORIF WRIST FRACTURE Left 05/07/2021   Procedure: OPEN REDUCTION INTERNAL FIXATION (ORIF) WRIST FRACTURE;  Surgeon: Alyse Agent, MD;  Location: [facility];  Service: Orthopedics;  Laterality: Left;  with MAC   PELVIC LAPAROSCOPY     DL with tubal lavage   Social History   Occupational History   Not on file  Tobacco Use   Smoking status: Former    Types: Cigarettes   Smokeless tobacco: Never   Tobacco comments:     Smoking cigarettes Quit age 55 smoked age 17 to 21 1 ppd per pt  Vaping Use   Vaping status: Never Used  Substance and Sexual Activity   Alcohol use: Yes    Alcohol/week: 0.0 standard drinks of alcohol    Comment: Rare   Drug use: No   Sexual activity: Never    Birth control/protection: Post-menopausal    Comment: 1st intercourse 79 yo-Fewer than 5 partners    Karryn Kosinski Estela) Arlinda, M.D. [facility], Hand Surgery  "

## 2024-11-27 ENCOUNTER — Ambulatory Visit: Admitting: Physician Assistant
